# Patient Record
Sex: Female | Born: 1995 | Race: White | Hispanic: No | Marital: Single | Smoking: Current every day smoker
Health system: Southern US, Community
[De-identification: ages and names within clinical notes are randomized; demographics above are authoritative.]

## PROBLEM LIST (undated history)

## (undated) ENCOUNTER — Emergency Department (HOSPITAL_COMMUNITY): Discharge status: Absent without leave | Payer: Self-pay

## (undated) DIAGNOSIS — F1110 Opioid abuse, uncomplicated: Secondary | ICD-10-CM

## (undated) HISTORY — PX: OTHER SURGICAL HISTORY: SHX169

---
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
## 2014-04-18 IMAGING — US US ABDOMEN COMPLETE
1 series · 14 of 25 positions shown · non-contrast
Comparison: None.

CLINICAL DATA: Right upper quadrant abdominal pain

ABDOMINAL ULTRASOUND COMPLETE

[Series 1: us abdomen complete · 0.21mm/px · 14 of 68 slices shown]
[im 1/68]
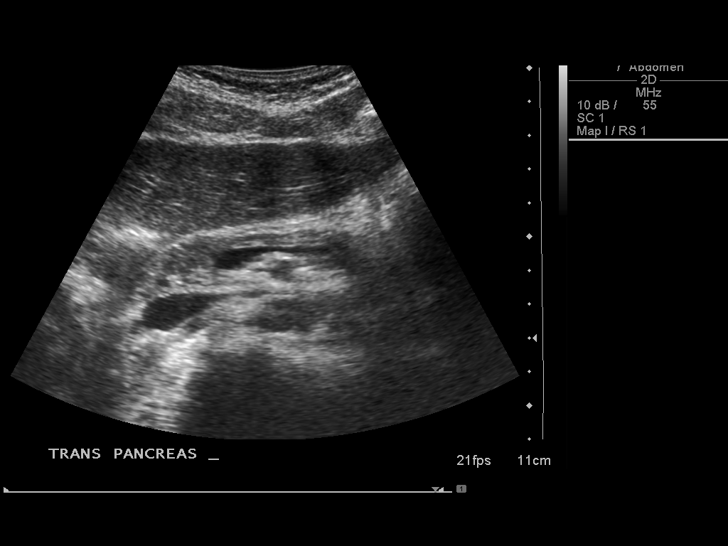
[im 6/68]
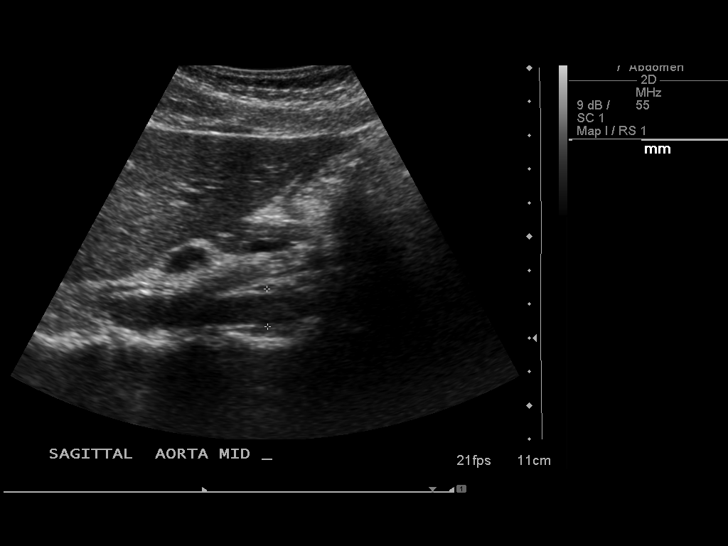
[im 12/68]
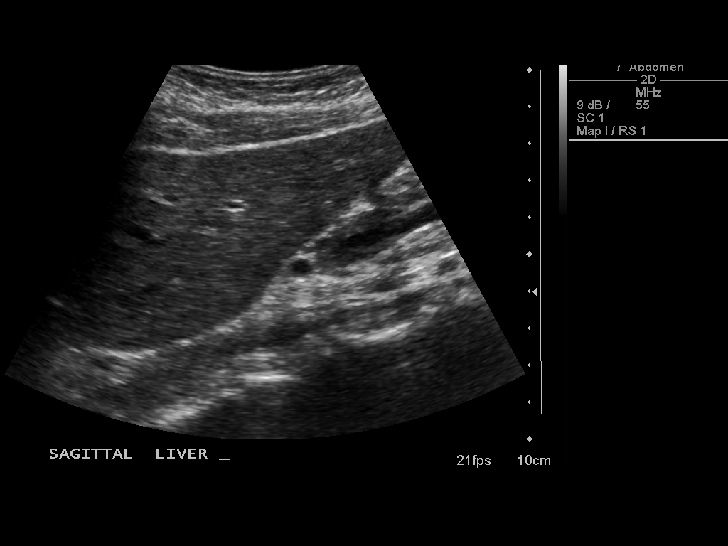
[im 17/68]
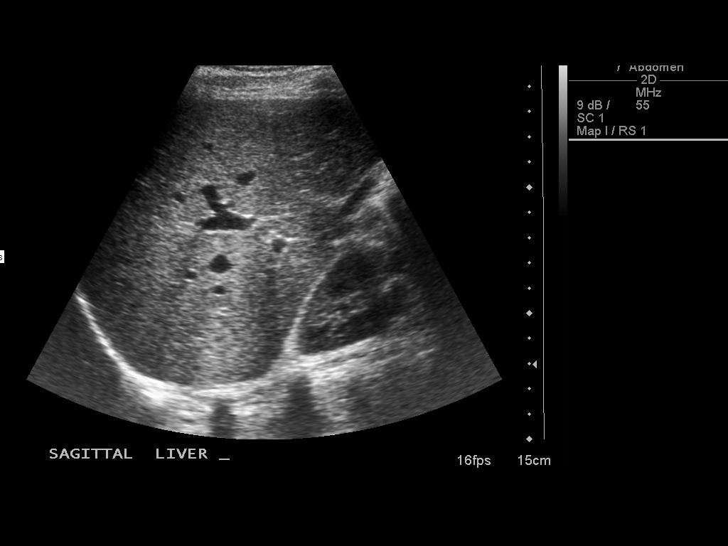
[im 23/68]
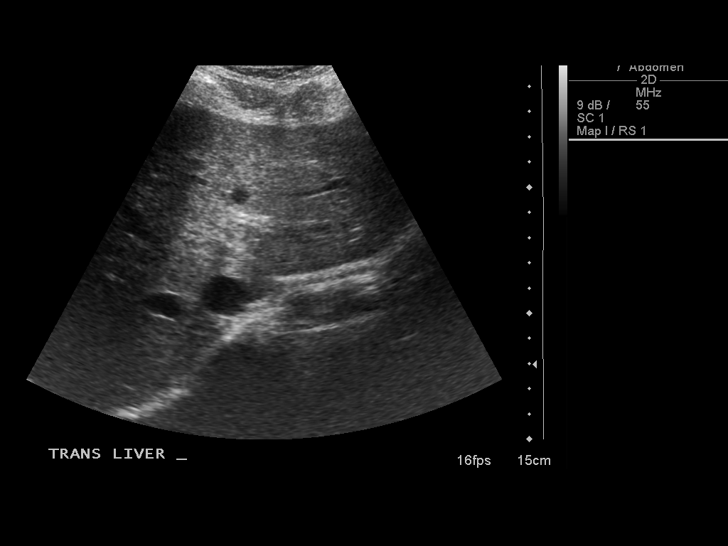
[im 26/68]
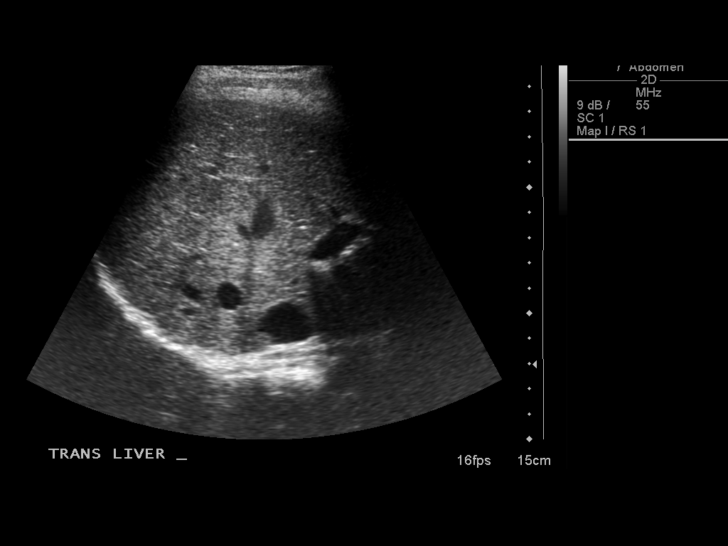
[im 31/68]
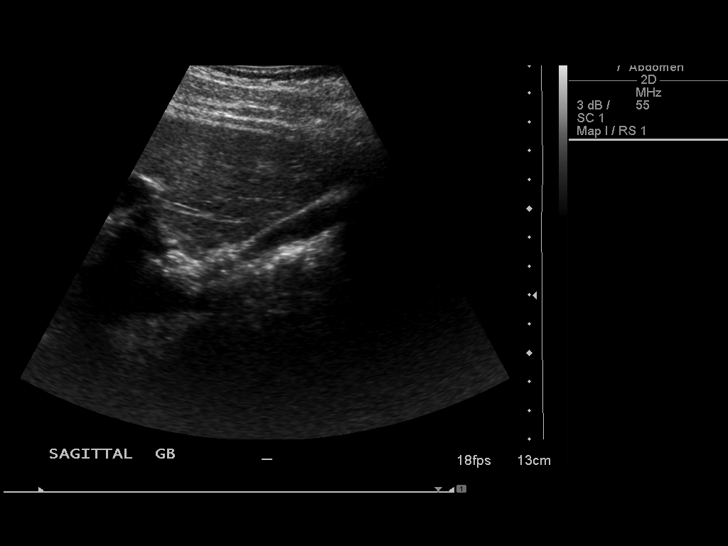
[im 37/68]
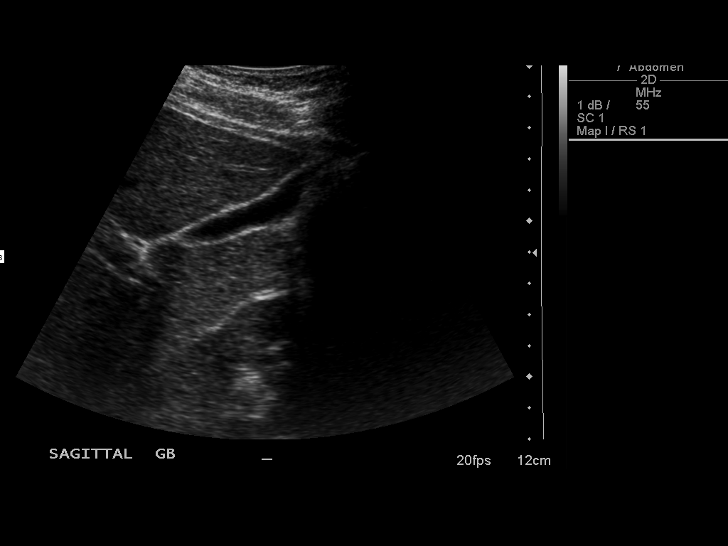
[im 42/68]
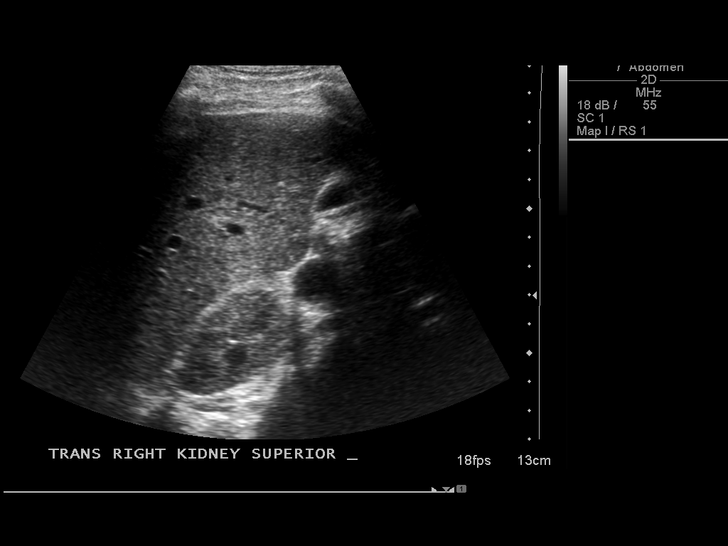
[im 45/68]
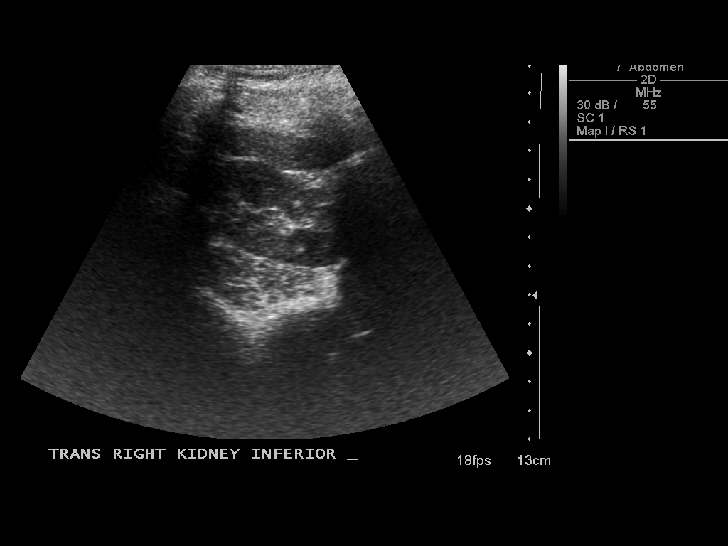
[im 51/68]
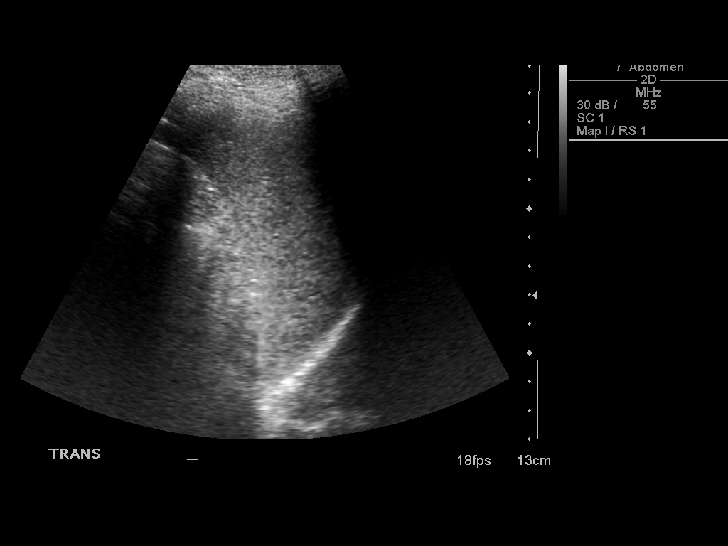
[im 56/68]
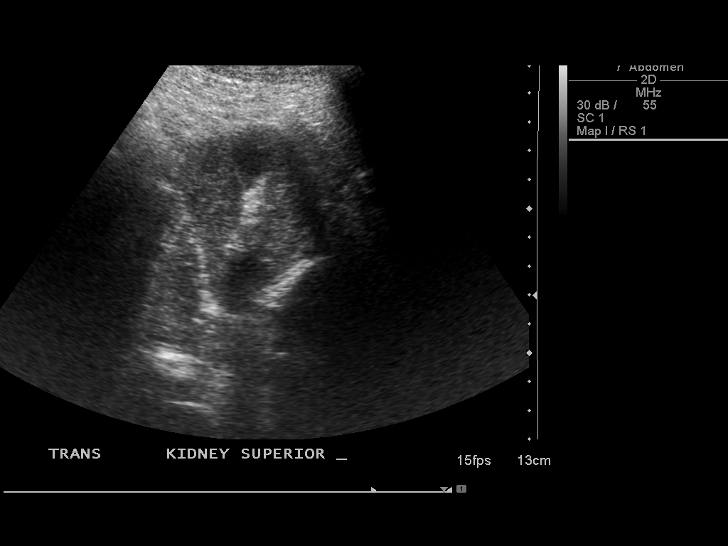
[im 62/68]
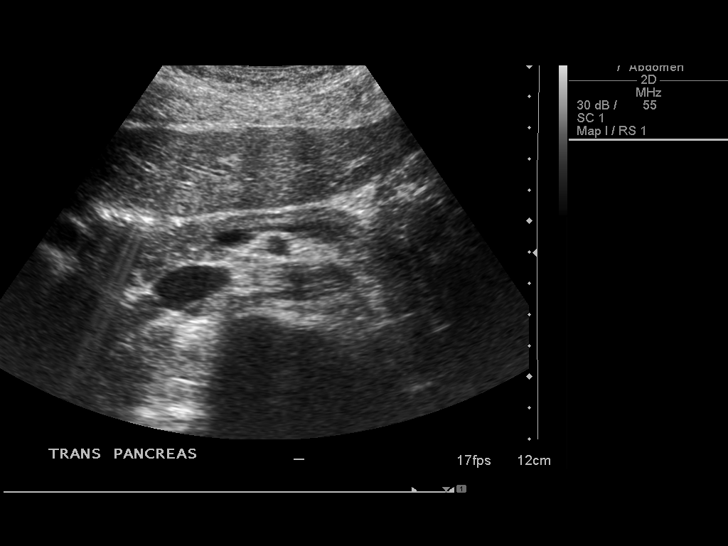
[im 68/68]
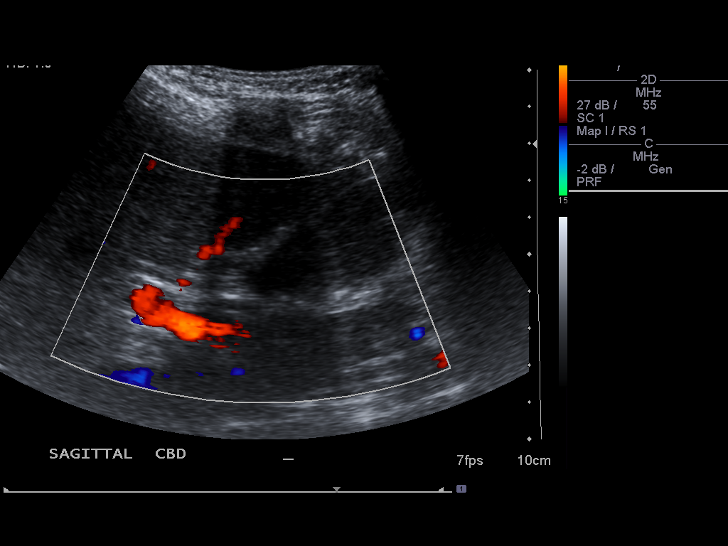

[14 of 25 positions shown; findings below may reference images not displayed]

FINDINGS: Gallbladder:  No gallstones, gallbladder wall thickening, or
pericholecystic fluid.

Common Bile Duct:  Within normal limits in caliber.

Liver: No focal mass lesion identified.  Within normal limits in
parenchymal echogenicity.

IVC:  Appears normal.

Pancreas:  No abnormality identified.

Spleen:  Within normal limits in size and echotexture.

Right kidney:  Normal in size and parenchymal echogenicity.  No
evidence of mass or hydronephrosis.

Left kidney:  Normal in size and parenchymal echogenicity.  No
evidence of mass or hydronephrosis.

Abdominal Aorta:  No aneurysm identified.
IMPRESSION: Negative abdominal ultrasound.

## 2014-08-17 ENCOUNTER — Inpatient Hospital Stay (HOSPITAL_COMMUNITY): Admit: 2014-08-17 | Payer: Self-pay

## 2014-10-06 ENCOUNTER — Ambulatory Visit (INDEPENDENT_AMBULATORY_CARE_PROVIDER_SITE_OTHER): Payer: Self-pay | Admitting: Emergency Medicine

## 2014-10-06 VITALS — BP 106/64 | HR 88 | Temp 98.0°F | Resp 18 | Ht 64.0 in | Wt 113.0 lb

## 2014-10-06 DIAGNOSIS — R0789 Other chest pain: Secondary | ICD-10-CM

## 2014-10-06 MED ORDER — TRAMADOL HCL 50 MG PO TABS: 50.0000 mg | ORAL_TABLET | Freq: Three times a day (TID) | ORAL | Status: DC | PRN

## 2014-10-06 MED ORDER — NAPROXEN SODIUM 550 MG PO TABS: 550.0000 mg | ORAL_TABLET | Freq: Two times a day (BID) | ORAL | Status: AC

## 2014-10-06 NOTE — Patient Instructions (Signed)

## 2014-10-06 NOTE — Progress Notes (Signed)
[facility] [address], [place] [id number] [phone]  Date:  10/06/2014   Name:  Emily StarrHeather R Logan   DOB:  10/05/1995   MRN:  [id number]  PCP:  No PCP Per Patient    Chief Complaint: Abdominal Pain; Mass; and Flank Pain   History of Present Illness:  Emily StarrHeather R Logan is a 19 y.o. very pleasant female patient who presents with the following:  Pain in right chest for past three weeks  Worse with position and breathing. Anterior and posterior chest wall Works stocking shelves and lifting No cough or coryza. No fever or chills No shortness of breath or wheezing No nausea or vomiting.  Appetite normal No improvement with over the counter medications or other home remedies.  Denies other complaint or health concern today.   There are no active problems to display for this patient.   History reviewed. No pertinent past medical history.  History reviewed. No pertinent past surgical history.  History  Substance Use Topics  . Smoking status: Current Every Day Smoker  . Smokeless tobacco: Not on file  . Alcohol Use: Yes    History reviewed. No pertinent family history.  No Known Allergies  Medication list has been reviewed and updated.  No current outpatient prescriptions on file prior to visit.   No current facility-administered medications on file prior to visit.    Review of Systems:  Review of Systems  Constitutional: Negative for fever, chills and fatigue.  HENT: Negative for congestion, ear pain, hearing loss, postnasal drip, rhinorrhea and sinus pressure.   Eyes: Negative for discharge and redness.  Respiratory: Negative for cough, shortness of breath and wheezing.   Cardiovascular: Negative for chest pain and leg swelling.  Gastrointestinal: Negative for nausea, vomiting, abdominal pain, constipation and blood in stool.  Genitourinary: Negative for dysuria, urgency and frequency.  Musculoskeletal: Negative for neck stiffness.  Skin:  Negative for rash.  Neurological: Negative for seizures, weakness and headaches.     Physical Examination: Filed Vitals:   10/06/14 2053  BP: 106/64  Pulse: 88  Temp: 98 F (36.7 C)  Resp: 18   Filed Vitals:   10/06/14 2053  Height: 5\' 4"  (1.626 m)  Weight: 113 lb (51.256 kg)   Body mass index is 19.39 kg/(m^2). Ideal Body Weight: Weight in (lb) to have BMI = 25: 145.3  GEN: WDWN, NAD, Non-toxic, A & O x 3 HEENT: Atraumatic, Normocephalic. Neck supple. No masses, No LAD. Ears and Nose: No external deformity. CV: RRR, No M/G/R. No JVD. No thrill. No extra heart sounds. PULM: CTA B, no wheezes, crackles, rhonchi. No retractions. No resp. distress. No accessory muscle use. ABD: S, NT, ND, +BS. No rebound. No HSM. EXTR: No c/c/e NEURO Normal gait.  PSYCH: Normally interactive. Conversant. Not depressed or anxious appearing.  Calm demeanor.  CHEST wall tender inferior right chest wall anteriorly.  No crepitus  Assessment and Plan: Chest wall pain Anaprox   Signed Phillips OdorJeffery Anderson, MD

## 2015-06-26 ENCOUNTER — Encounter (HOSPITAL_COMMUNITY): Payer: Self-pay | Admitting: *Deleted

## 2015-06-26 ENCOUNTER — Emergency Department (HOSPITAL_COMMUNITY)
Admission: EM | Admit: 2015-06-26 | Discharge: 2015-06-27 | Disposition: A | Discharge status: Home / Self Care | Payer: Self-pay | Attending: Emergency Medicine | Admitting: Emergency Medicine

## 2015-06-26 DIAGNOSIS — Z3202 Encounter for pregnancy test, result negative: Secondary | ICD-10-CM | POA: Insufficient documentation

## 2015-06-26 DIAGNOSIS — F1910 Other psychoactive substance abuse, uncomplicated: Secondary | ICD-10-CM

## 2015-06-26 DIAGNOSIS — F1110 Opioid abuse, uncomplicated: Secondary | ICD-10-CM | POA: Insufficient documentation

## 2015-06-26 DIAGNOSIS — R569 Unspecified convulsions: Secondary | ICD-10-CM | POA: Insufficient documentation

## 2015-06-26 DIAGNOSIS — F17200 Nicotine dependence, unspecified, uncomplicated: Secondary | ICD-10-CM | POA: Insufficient documentation

## 2015-06-26 DIAGNOSIS — Z791 Long term (current) use of non-steroidal anti-inflammatories (NSAID): Secondary | ICD-10-CM | POA: Insufficient documentation

## 2015-06-26 NOTE — ED Notes (Signed)
Called for triage no answer  

## 2015-06-26 NOTE — ED Notes (Signed)
PA-C to see and assess patient before RN assessment. See PA-C note. 

## 2015-06-26 NOTE — ED Notes (Signed)
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 Pt denies SI/HI. Pt denies any other complaints.

## 2015-06-27 LAB — I-STAT CHEM 8, ED
BUN: 19 mg/dL (ref 6–20)
CALCIUM ION: 1.28 mmol/L — AB (ref 1.12–1.23)
CHLORIDE: 97 mmol/L — AB (ref 101–111)
CREATININE: 0.8 mg/dL (ref 0.44–1.00)
GLUCOSE: 72 mg/dL (ref 65–99)
HCT: 47 % — ABNORMAL HIGH (ref 36.0–46.0)
HEMOGLOBIN: 16 g/dL — AB (ref 12.0–15.0)
POTASSIUM: 4.3 mmol/L (ref 3.5–5.1)
Sodium: 138 mmol/L (ref 135–145)
TCO2: 33 mmol/L (ref 0–100)

## 2015-06-27 LAB — POC URINE PREG, ED: Preg Test, Ur: NEGATIVE

## 2015-06-27 NOTE — Discharge Instructions (Signed)
1. Medications: usual home medications 2. Treatment: rest, drink plenty of fluids 3. Follow Up: please followup with your primary doctor and use resource list for outpatient detox; if you do not have a primary care doctor use the resource guide provided to find one; please return to the ER for new or worsening symptoms   Polysubstance Abuse When people abuse more than one drug or type of drug it is called polysubstance or polydrug abuse. For example, many smokers also drink alcohol. This is one form of polydrug abuse. Polydrug abuse also refers to the use of a drug to counteract an unpleasant effect produced by another drug. It may also be used to help with withdrawal from another drug. People who take stimulants may become agitated. Sometimes this agitation is countered with a tranquilizer. This helps protect against the unpleasant side effects. Polydrug abuse also refers to the use of different drugs at the same time.  Anytime drug use is interfering with normal living activities, it has become abuse. This includes problems with family and friends. Psychological dependence has developed when your mind tells you that the drug is needed. This is usually followed by physical dependence which has developed when continuing increases of drug are required to get the same feeling or "high". This is known as addiction or chemical dependency. A person's risk is much higher if there is a history of chemical dependency in the family. SIGNS OF CHEMICAL DEPENDENCY  You have been told by friends or family that drugs have become a problem.  You fight when using drugs.  You are having blackouts (not remembering what you do while using).  You feel sick from using drugs but continue using.  You lie about use or amounts of drugs (chemicals) used.  You need chemicals to get you going.  You are suffering in work performance or in school because of drug use.  You get sick from use of drugs but continue to use  anyway.  You need drugs to relate to people or feel comfortable in social situations.  You use drugs to forget problems. "Yes" answered to any of the above signs of chemical dependency indicates there are problems. The longer the use of drugs continues, the greater the problems will become. If there is a family history of drug or alcohol use, it is best not to experiment with these drugs. Continual use leads to tolerance. After tolerance develops more of the drug is needed to get the same feeling. This is followed by addiction. With addiction, drugs become the most important part of life. It becomes more important to take drugs than participate in the other usual activities of life. This includes relating to friends and family. Addiction is followed by dependency. Dependency is a condition where drugs are now needed not just to get high, but to feel normal. Addiction cannot be cured but it can be stopped. This often requires outside help and the care of professionals. Treatment centers are listed in the yellow pages under: Cocaine, Narcotics, and Alcoholics Anonymous. Most hospitals and clinics can refer you to a specialized care center. Talk to your caregiver if you need help.   This information is not intended to replace advice given to you by your health care provider. Make sure you discuss any questions you have with your health care provider.   Document Released: 01/12/2005 Document Revised: 08/15/2011 Document Reviewed: 05/28/2014 Elsevier Interactive Patient Education 2016 [employer].   Emergency Department Resource Guide 1) Find a Librarian, academic and Pay  Out of Pocket Although you won't have to find out who is covered by your insurance plan, it is a good idea to ask around and get recommendations. You will then need to call the office and see if the doctor you have chosen will accept you as a new patient and what types of options they offer for patients who are self-pay. Some doctors offer  discounts or will set up payment plans for their patients who do not have insurance, but you will need to ask so you aren't surprised when you get to your appointment.  2) Contact Your Local Health Department Not all health departments have doctors that can see patients for sick visits, but many do, so it is worth a call to see if yours does. If you don't know where your local health department is, you can check in your phone book. The CDC also has a tool to help you locate your state's health department, and many state websites also have listings of all of their local health departments.  3) Find a Walk-in Clinic If your illness is not likely to be very severe or complicated, you may want to try a walk in clinic. These are popping up all over the country in pharmacies, drugstores, and shopping centers. They're usually staffed by nurse practitioners or physician assistants that have been trained to treat common illnesses and complaints. They're usually fairly quick and inexpensive. However, if you have serious medical issues or chronic medical problems, these are probably not your best option.  No Primary Care Doctor: - Call Health Connect at  [phone] - they can help you locate a primary care doctor that  accepts your insurance, provides certain services, etc. - Physician Referral Service- [phone]  Chronic Pain Problems: Organization         Address  Phone   Notes  [facility]  [phone] Patients need to be referred by their primary care doctor.   Medication Assistance: Organization         Address  Phone   Notes  [facility] Medication [facility] [address] [place], [place] [address] [phone] --Must be a resident of [place] -- Must have NO insurance coverage whatsoever (no Medicaid/ Medicare, etc.) -- The pt. MUST have a primary care doctor that directs their care regularly and follows them in the community   MedAssist   [phone]   [employer]  [phone]    Agencies that provide inexpensive medical care: Organization         Address  Phone   Notes  [facility]  [phone]   [facility]    [phone]   [facility] [address] [place], [place] [address] [phone]   [facility] [address], [place] [phone]   [facility]anned Parenthood    [phone]) [phone]   [facility]    [phone]   [facility] and [facility]  [address], [place] Phone:  [phone], Fax:  [phone] Hours of Operation:  9 am - 6 pm, M-F.  Also accepts Medicaid/Medicare and self-pay.  [facility]  [address], [place] Phone: [phone], Fax: [phone]) [phone]. Hours of Operation:  8:30 am - 5:30 pm, M-F.  Also accepts Medicaid and self-pay.  [facility] [place] [address], [place] Phone: [phone]) [phone]  [facility] [address], [place], [place] [phone], Ext. [phone] Mondays & Thursdays: 7-9 AM.  First 15 patients are seen on a first come, first serve basis.    Medicaid-accepting [facility] [place] Providers:  Organization         Address  Phone   Notes  [facility] [address], [place] [phone] Also accepts self-pay patients.  [facility] [address], [place]  [phone]) [phone]   [facility] [address], [place] [phone]   [facility] [address] [place] [address], [place] [phone]   Renaye RakersVeita Bland [address], [place]   [phone] Only accepts [place] Access [place] patients after they have their name applied to their card.   Self-Pay (no insurance) in [place] [place]:  Organization         Address  Phone   Notes  Sickle Cell Patients, [facility] [address], [place] [phone]   [facility] [address], [place] [phone]   [facility] [place]  [address],  [phone]   [facility]/Dr. Osei-Bonsu  [address] [place] [address], [place] or [phone], [place] [phone] Phone number for both [place] and [place] locations is the same.  [facility] [address], [place] [phone]   [facility] [place] [address] [place] [address], [place] or [address] [phone](336) [phone]) [phone]   [facility] [address], [place] [phone], phone; [phone], fax Sees patients 1st and 3rd Saturday of every month.  Must not qualify for public or private insurance (i.e. Medicaid, Medicare, [place] Health Choice, Veterans' Benefits)  Household income should be no more than 200% of the poverty level The clinic cannot treat you if you are pregnant or think you are pregnant  Sexually transmitted diseases are not treated at the clinic.    Dental Care: Organization         Address  Phone  Notes  [facility] of [facility] [address], [place] [phone] Accepts children up to age 621 who are enrolled in [place]icaid or [place] Health Choice; pregnant women with a Medicaid card; and children who have applied for Medicaid or [place] Health Choice, but were declined, whose parents can pay a reduced fee at time of service.  [facility] [place] [facility] of [facility] [place]  [address], [place] [phone]) [phone] Accepts children up to age 22 who are enrolled in [place] or [place] Health Choice; pregnant women with a Medicaid card; and children who have applied for Medicaid or [place] Health Choice, but were declined, whose parents can pay a reduced fee at time of service.  [facility]  [address], [place]  [phone] Patients are seen by appointment only. Walk-ins are not accepted. [facility] will see patients 22 years of age and older. Monday - Tuesday (8am-5pm) Most Wednesdays (8:30-5pm) $30 per visit, cash only  [facility]  [address], [facility] [phone] Patients are seen by appointment only. Walk-ins are not accepted. [facility] will see patients 22 years of age and older. One Wednesday Evening (Monthly: Volunteer Based).  $30 per visit,  cash only  [employer] [facility]  [phone] for adults; Children under age 864, call Graduate Pediatric Dentistry at [phone]. Children aged 404-14, please call [phone]) [phone] to request a pediatric application.  Dental services are provided in all areas of dental care including fillings, crowns and bridges, complete and partial dentures, implants, gum treatment, root canals, and extractions. Preventive care is also provided. Treatment is provided to both adults and children. Patients are selected via a lottery and there is often a waiting list.   [facility] [address], [place]  [phone] [email or web address]   [facility] [address], [place], [place] [phone], Ext. [phone] Second and Fourth Thursday of each month, opens at 6:30 AM; Clinic ends at 9 AM.  Patients are seen on a first-come first-served basis, and a limited number are seen during each clinic.   [facility]  [address], [place], [place] [phone]) [phone]   Eligibility Requirements You must have lived in [place], [place], or [place] counties for at least the last three months.   You cannot be eligible for state or federal sponsored National Cityhealthcare insurance, including CIGNAVeterans Administration, [place]icaid, or [employer] EntertainmentMedicare.   You generally cannot be eligible for healthcare insurance through your employer.    How to apply: Eligibility screenings are held every Tuesday and Wednesday  afternoon from 1:00 pm until 4:00 pm. You do not need an appointment for the interview!  [facility] [address], [place], [place] [phone]   [facility]  [phone]   [facility]  [phone]   [facility]  [phone]    Behavioral Health Resources in the Community: Intensive Outpatient Programs Organization         Address  Phone  Notes  [facility] [address], [place], [place] [phone]   [facility] [address], [place], [place] [phone]   [facility] [address], [place], [place]  [phone]   [facility] [address],  [place], [place] [phone] or [phone]   Substance Abuse Resources Organization         Address  Phone  Notes  [facility]  [phone]   [facility]  [phone]   [facility]  [phone]   [facility]  [phone]   Residential & Outpatient Substance Abuse Program  [phone]   Psychological Services Organization         Address  Phone  Notes  [facility]  [phone]- [phone]   [facility]  [phone]   [facility] [address], [place] [phone] or [phone]    Mobile Crisis Teams Organization         Address  Phone  Notes  [facility], Mobile Crisis Care Unit  [phone]   [facility]  [address] [place], [place] [phone]   Doristine LocksSharon DeEsch [address] [place] [phone]    Self-Help/Support Groups Organization         Address  Phone             Notes  [facility] [place] - variety of support groups  336- [phone] Call for more information  Narcotics Anonymous (NA), [facility] [address], [place]  2 meetings at this location   Nutritional therapistesidential Treatment  Programs Organization  Address  Phone  Notes  °[facility] [address],    °[place]  [phone]   °[facility] ° [address], [place], [place] [phone]   °[facility] [address], [place] [phone] Admissions: 8am-3pm M-F  °[facility] [address]    °[place], [place] [phone]   °[facility] [address], [place], [place] [phone]   °[facility] [address]  °[place], [place] [phone]   °[facility] - Intensive Outpatient [address], [place], [place] [phone]   °[facility] ([facility] [address]  °[place], [place] 1-877-615-2722 or [phone]   °[facility] ([facility]) [address] [place], [place] [phone] Accepts Medicaid  °[facility] [address]  °[place] 1-800-659-3381 Substance Abuse/Addiction Treatment  ° °[place] Behavioral Health Resources °Organization         Address  Phone  Notes  °[facility]  ([phone]) [phone]   °Julie Brannon, PhD [address] [place], [place]   ([phone]) [phone] or (336) 951-0000   °[facility]   [address] °[place], [place] (336) [phone]   °[facility] [address], [place], [place] (336) [phone] Insurance/Medicaid/sponsorship through [facility]  °[facility] [address]                                    [place], [place] ([phone]) [phone] Therapy/tele-psych/case  °[facility] [address]  ° [place],  ([phone]) [phone]    °Dr. Arfeen  ([phone]) [phone]   °[facility] [place]  [employer] [place] [facility] 1) [address], [place] °2) [address], [place] °3)  [address], [place] (336) [phone] °(336) [phone] ° °(336) [phone]   °[place] Child Abuse Hotline (336) 342-1394 or (336) [phone] (After Hours)    ° ° ° °

## 2015-06-27 NOTE — ED Provider Notes (Signed)
CSN: [id number]     Arrival date & time 06/26/15  2301 History   First MD Initiated Contact with Patient 06/26/15 2330     Chief Complaint  Patient presents with  . Addiction Problem    HPI   Emily Logan is a 20 y.o. 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 She states she uses 2 grams daily. She denies additional drug use. She denies alcohol use. She denies homicidal or suicidal ideation. She denies hallucinations. She denies fever, chills, headaches, lightheadedness, dizziness, numbness, weakness, paresthesia, chest pain, shortness of breath, abdominal pain, N/V. She states she has experienced intermittent "seizures" over the past month, during which she "tenses up and falls out." She reports this last occurred several days ago and denies exacerbating or alleviating factors.    History reviewed. No pertinent past medical history. History reviewed. No pertinent past surgical history. History reviewed. No pertinent family history. Social History  Substance Use Topics  . Smoking status: Current Every Day Smoker  . Smokeless tobacco: Never Used  . Alcohol Use: No   OB History    No data available      Review of Systems  Constitutional: Negative for fever and chills.  Eyes: Negative for visual disturbance.  Respiratory: Negative for shortness of breath.   Cardiovascular: Negative for chest pain.  Gastrointestinal: Negative for nausea, vomiting, abdominal pain, diarrhea and constipation.  Neurological: Positive for seizures. Negative for dizziness, syncope, weakness, light-headedness, numbness and headaches.  Psychiatric/Behavioral: Negative for suicidal ideas and hallucinations.  All other systems reviewed and are negative.     Allergies  Review of patient's allergies indicates no known allergies.  Home Medications   Prior to Admission medications   Medication Sig Start Date End Date Taking? Authorizing Provider  naproxen sodium  (ANAPROX DS) 550 MG tablet Take 1 tablet (550 mg total) by mouth 2 (two) times daily with a meal. 10/06/14 10/06/15  Carmelina Dane, MD  traMADol (ULTRAM) 50 MG tablet Take 1 tablet (50 mg total) by mouth every 8 (eight) hours as needed. 10/06/14   Carmelina Dane, MD    BP 112/68 mmHg  Pulse 97  Temp(Src) 97.5 F (36.4 C) (Oral)  Resp 18  SpO2 100% Physical Exam  Constitutional: She is oriented to person, place, and time. She appears well-developed and well-nourished. No distress.  HENT:  Head: Normocephalic and atraumatic.  Right Ear: External ear normal.  Left Ear: External ear normal.  Nose: Nose normal.  Mouth/Throat: Uvula is midline, oropharynx is clear and moist and mucous membranes are normal.  Eyes: Conjunctivae, EOM and lids are normal. Pupils are equal, round, and reactive to light. Right eye exhibits no discharge. Left eye exhibits no discharge. No scleral icterus.  Neck: Normal range of motion. Neck supple.  Cardiovascular: Normal rate, regular rhythm, normal heart sounds, intact distal pulses and normal pulses.   Pulmonary/Chest: Effort normal and breath sounds normal. No respiratory distress. She has no wheezes. She has no rales.  Abdominal: Soft. Normal appearance and bowel sounds are normal. She exhibits no distension and no mass. There is no tenderness. There is no rigidity, no rebound and no guarding.  Musculoskeletal: Normal range of motion. She exhibits no edema or tenderness.  Neurological: She is alert and oriented to person, place, and time. She has normal strength. No cranial nerve deficit or sensory deficit.  Skin: Skin is warm, dry and intact. No rash noted. She is not diaphoretic. No erythema.  No pallor.  Psychiatric: She has a normal mood and affect. Her speech is normal and behavior is normal. She expresses no homicidal and no suicidal ideation. She expresses no suicidal plans and no homicidal plans.  Nursing note and vitals reviewed.   ED Course   Procedures (including critical care time)  Labs Review Labs Reviewed  I-STAT CHEM 8, ED - Abnormal; Notable for the following:    Chloride 97 (*)    Calcium, Ion 1.28 (*)    Hemoglobin 16.0 (*)    HCT 47.0 (*)    All other components within normal limits  POC URINE PREG, ED    Imaging Review No results found.   I have personally reviewed and evaluated these lab results as part of my medical decision-making.   EKG Interpretation None      MDM   Final diagnoses:  Drug abuse    20 year old female presents for heroin detox. Also reports intermittent seizures.   Patient is afebrile. Vital signs stable. Normal neuro exam with no focal deficit. Heart RRR. Lungs clear to auscultation bilaterally. Abdomen soft, non-tender, non-distended. Patient moves all extremities and ambulates without difficulty.  Patient discussed with Dr. Verdie Mosher. Will obtain EKG, urine pregnancy, and chem 8. EKG sinus rhythm. Urine pregnancy negative. Chem 8 unremarkable.  Patient is non-toxic and well-appearing, feel she is stable for discharge at this time. No seizure activity in the ED. Patient to follow-up with neurology and to use resource list for outpatient detox. Return precautions discussed. Patient verbalizes her understanding and is in agreement with plan.  BP 112/68 mmHg  Pulse 97  Temp(Src) 97.5 F (36.4 C) (Oral)  Resp 18  SpO2 100%     Mady Gemma, PA-C 06/27/15 1308  Lavera Guise, MD 06/27/15 1316

## 2015-06-27 NOTE — ED Notes (Signed)
Patient verbalized understanding of discharge instructions and denies any further needs or questions at this time. VS stable. Patient ambulatory with steady gait. Patient given resource guide to contact a facility for detox.

## 2015-07-23 ENCOUNTER — Emergency Department
Admission: EM | Admit: 2015-07-23 | Discharge: 2015-07-23 | Discharge status: Against Medical Advice | Payer: Self-pay | Attending: Emergency Medicine | Admitting: Emergency Medicine

## 2015-07-23 ENCOUNTER — Encounter: Payer: Self-pay | Admitting: Emergency Medicine

## 2015-07-23 DIAGNOSIS — Z5321 Procedure and treatment not carried out due to patient leaving prior to being seen by health care provider: Secondary | ICD-10-CM | POA: Insufficient documentation

## 2015-07-23 DIAGNOSIS — Z5329 Procedure and treatment not carried out because of patient's decision for other reasons: Secondary | ICD-10-CM

## 2015-07-23 DIAGNOSIS — F17200 Nicotine dependence, unspecified, uncomplicated: Secondary | ICD-10-CM | POA: Insufficient documentation

## 2015-07-23 DIAGNOSIS — F1110 Opioid abuse, uncomplicated: Secondary | ICD-10-CM | POA: Insufficient documentation

## 2015-07-23 DIAGNOSIS — Z5320 Procedure and treatment not carried out because of patient's decision for unspecified reasons: Secondary | ICD-10-CM

## 2015-07-23 NOTE — ED Notes (Signed)

## 2015-07-23 NOTE — Discharge Instructions (Signed)
Opioid Use Disorder  Opioid use disorder is a mental disorder. It is the continued nonmedical use of opioids in spite of risks to health and well-being. Misused opioids include the street drug heroin. They also include pain medicines such as morphine, hydrocodone, oxycodone, and fentanyl. Opioids are very addictive. People who misuse opioids get an exaggerated feeling of well-being. Opioid use disorder often disrupts activities at home, work, or school. It may cause mental or physical problems.   A family history of opioid use disorder puts you at higher risk of it. People with opioid use disorder often misuse other drugs or have mental illness such as depression, posttraumatic stress disorder, or antisocial personality disorder. They also are at risk of suicide and death from overdose.  SIGNS AND SYMPTOMS   Signs and symptoms of opioid use disorder include:  · Use of opioids in larger amounts or over a longer period than intended.  · Unsuccessful attempts to cut down or control opioid use.  · A lot of time spent obtaining, using, or recovering from the effects of opioids.  · A strong desire or urge to use opioids (craving).  · Continued use of opioids in spite of major problems at work, school, or home because of use.  · Continued use of opioids in spite of relationship problems because of use.  · Giving up or cutting down on important life activities because of opioid use.  · Use of opioids over and over in situations when it is physically hazardous, such as driving a car.  · Continued use of opioids in spite of a physical problem that is likely related to use. Physical problems can include:  ¨ Severe constipation.  ¨ Poor nutrition.  ¨ Infertility.  ¨ Tuberculosis.  ¨ Aspiration pneumonia.  ¨ Infections such as human immunodeficiency virus (HIV) and hepatitis (from injecting opioids).  · Continued use of opioids in spite of a mental problem that is likely related to use. Mental problems can  include:  ¨ Depression.  ¨ Anxiety.  ¨ Hallucinations.  ¨ Sleep problems.  ¨ Loss of sexual function.  · Need to use more and more opioids to get the same effect, or lessened effect over time with use of the same amount (tolerance).  · Having withdrawal symptoms when opioid use is stopped, or using opioids to reduce or avoid withdrawal symptoms. Withdrawal symptoms include:  ¨ Depressed, anxious, or irritable mood.  ¨ Nausea, vomiting, diarrhea, or intestinal cramping.  ¨ Muscle aches or spasms.  ¨ Excessive tearing or runny nose.  ¨ Dilated pupils, sweating, or hairs standing on end.  ¨ Yawning.  ¨ Fever, raised blood pressure, or fast pulse.  ¨ Restlessness or trouble sleeping. This does not apply to people taking opioids for medical reasons only.  DIAGNOSIS  Opioid use disorder is diagnosed by your health care provider. You may be asked questions about your opioid use and and how it affects your life. A physical exam may be done. A drug screen may be ordered. You may be referred to a mental health professional. The diagnosis of opioid use disorder requires at least two symptoms within 12 months. The type of opioid use disorder you have depends on the number of signs and symptoms you have. The type may be:  · Mild. Two or three signs and symptoms.     · Moderate. Four or five signs and symptoms.    · Severe. Six or more signs and symptoms.  TREATMENT   Treatment is usually provided by mental   health professionals with training in substance use disorders. The following options are available:  · Detoxification. This is the first step in treatment for withdrawal. It is medically supervised withdrawal with the use of medicines. These medicines lessen withdrawal symptoms. They also raise the chance of becoming opioid free.  · Counseling, also known as talk therapy. Talk therapy addresses the reasons you use opioids. It also addresses ways to keep you from using again (relapse). The goals of talk therapy are to avoid  relapse by:  ¨ Identifying and avoiding triggers for use.  ¨ Finding healthy ways to cope with stress.  ¨ Learning how to handle cravings.  · Support groups. Support groups provide emotional support, advice, and guidance.  · A medicine that blocks opioid receptors in your brain. This medicine can reduce opioid cravings that lead to relapse. This medicine also blocks the desired opioid effect when relapse occurs.  · Opioids that are taken by mouth in place of the misused opioid (opioid maintenance treatment). These medicines satisfy cravings but are safer than commonly misused opioids. This often is the best option for people who continue to relapse with other treatments.  HOME CARE INSTRUCTIONS   · Take medicines only as directed by your health care provider.  · Check with your health care provider before starting new medicines.  · Keep all follow-up visits as directed by your health care provider.  SEEK MEDICAL CARE IF:  · You are not able to take your medicines as directed.  · Your symptoms get worse.  SEEK IMMEDIATE MEDICAL CARE IF:  · You have serious thoughts about hurting yourself or others.  · You may have taken an overdose of opioids.  FOR MORE INFORMATION  · National Institute on Drug Abuse: www.drugabuse.gov  · Substance Abuse and Mental Health Services Administration: www.samhsa.gov     This information is not intended to replace advice given to you by your health care provider. Make sure you discuss any questions you have with your health care provider.     Document Released: 03/20/2007 Document Revised: 06/13/2014 Document Reviewed: 06/05/2013  Elsevier Interactive Patient Education ©2016 [employer]

## 2015-07-23 NOTE — ED Notes (Signed)
20 yof PMhx IV drug use presents to ED via EMS. Per EMS she called 911 after her boyfriend went unresponsive from 'shooting heroin'. She presents AAOx3, slightly slurred speech. Steady gait. Demanding to leave ED.

## 2015-07-23 NOTE — ED Provider Notes (Addendum)
[facility] Emergency Department Provider Note  ____________________________________________  Time seen: Approximately 1030 PM  I have reviewed the triage vital signs and the nursing notes.   HISTORY  Chief Complaint Drug Overdose    HPI Emily Logan is a 20 y.o. female history of heroin addiction who is presenting tonight with EMS after using heroin. She was not somnolent and did not require any Narcan. She initially had a slightly slurred speech which has now cleared. She admits to using heroin, shooting in her right hand, several hours ago. She denies using any other substances. She has no complaints at this time. She is here with her boyfriend, who she had initially called EMS for because of him being unresponsive after injecting heroin. She is wishing to leave without any further evaluation at this time.   History reviewed. No pertinent past medical history.  There are no active problems to display for this patient.   History reviewed. No pertinent past surgical history.  Current Outpatient Rx  Name  Route  Sig  Dispense  Refill  . naproxen sodium (ANAPROX DS) 550 MG tablet   Oral   Take 1 tablet (550 mg total) by mouth 2 (two) times daily with a meal. Patient not taking: Reported on 07/23/2015   40 tablet   0   . traMADol (ULTRAM) 50 MG tablet   Oral   Take 1 tablet (50 mg total) by mouth every 8 (eight) hours as needed. Patient not taking: Reported on 07/23/2015   30 tablet   0     Allergies Review of patient's allergies indicates no known allergies.  History reviewed. No pertinent family history.  Social History Social History  Substance Use Topics  . Smoking status: Current Every Day Smoker  . Smokeless tobacco: Never Used  . Alcohol Use: No    Review of Systems Constitutional: No fever/chills Eyes: No visual changes. ENT: No sore throat. Cardiovascular: Denies chest pain. Respiratory: Denies shortness of  breath. Gastrointestinal: No abdominal pain.  No nausea, no vomiting.  No diarrhea.  No constipation. Genitourinary: Negative for dysuria. Musculoskeletal: Negative for back pain. Skin: Negative for rash. Neurological: Negative for headaches, focal weakness or numbness.  10-point ROS otherwise negative.  ____________________________________________   PHYSICAL EXAM:  VITAL SIGNS: ED Triage Vitals  Enc Vitals Group     BP 07/23/15 2225 126/78 mmHg     Pulse Rate 07/23/15 2225 101     Resp 07/23/15 2225 10     Temp 07/23/15 2225 97.2 F (36.2 C)     Temp src --      SpO2 07/23/15 2225 92 %     Weight 07/23/15 2225 110 lb (49.896 kg)     Height 07/23/15 2225  (1.626 m)     Head Cir --      Peak Flow --      Pain Score --      Pain Loc --      Pain Edu? --      Excl. in GC? --     Constitutional: Alert and oriented. Well appearing and in no acute distress. Eyes: Conjunctivae are normal. PERRL. EOMI. Head: Atraumatic. Nose: No congestion/rhinnorhea. Mouth/Throat: Mucous membranes are moist.   Neck: No stridor.   Cardiovascular: Normal rate, regular rhythm. Grossly normal heart sounds.  Good peripheral circulation. Respiratory: Normal respiratory effort.  No retractions. Lungs CTAB. Gastrointestinal: Soft and nontender. No distention.  Musculoskeletal: No lower extremity tenderness nor edema.  No joint effusions.  Neurologic:  Normal speech and language. No gross focal neurologic deficits are appreciated. No gait instability. Skin:  Skin is warm, dry and intact. No rash noted. Right thumb with several punctate purple lesions which appears to track marks. There is no tenderness, induration or pus surrounding.  Psychiatric: Mood and affect are normal. Speech and behavior are normal.  ____________________________________________   LABS (all labs ordered are listed, but only abnormal results are displayed)  Labs Reviewed - No data to  display ____________________________________________  EKG   ____________________________________________  RADIOLOGY   ____________________________________________   PROCEDURES   ____________________________________________   INITIAL IMPRESSION / ASSESSMENT AND PLAN / ED COURSE  Pertinent labs & imaging results that were available during my care of the patient were reviewed by me and considered in my medical decision making (see chart for details).  Patient wishing to leave AGAINST MEDICAL ADVICE at this time. She says she has not used heroin several hours. She is alert and oriented and clinically sober at this time. She has insight into her illness and has capacity to make decisions. I discussed with her further observation of her boyfriend as his Narcan may wear off and he made become unconscious and stopped breathing again. She says that she will be able to watch him. She knows that she may return to the emergency department at any time for further observation. She is aware that a prolonged observation period about 4 hours would be optimal for her and her boyfriend but she is still wishing to leave. She knows that leaving without prolonged observation may result in death or permanent disability including brain damage. She does she may return to the emergency department any time. ____________________________________________   FINAL CLINICAL IMPRESSION(S) / ED DIAGNOSES  Heroin abuse. Against medical advise discharge.    Myrna Blazer, MD 07/23/15 2254 Patient denies any suicidal intent. Uses recreationally.   Myrna Blazer, MD 07/23/15 [phone]

## 2017-08-02 ENCOUNTER — Emergency Department (HOSPITAL_COMMUNITY)
Admission: EM | Admit: 2017-08-02 | Discharge: 2017-08-02 | Disposition: A | Discharge status: Home / Self Care | Payer: Self-pay | Attending: Emergency Medicine | Admitting: Emergency Medicine

## 2017-08-02 ENCOUNTER — Encounter (HOSPITAL_COMMUNITY): Payer: Self-pay | Admitting: Emergency Medicine

## 2017-08-02 DIAGNOSIS — F17210 Nicotine dependence, cigarettes, uncomplicated: Secondary | ICD-10-CM | POA: Insufficient documentation

## 2017-08-02 DIAGNOSIS — N72 Inflammatory disease of cervix uteri: Secondary | ICD-10-CM | POA: Insufficient documentation

## 2017-08-02 LAB — URINALYSIS, ROUTINE W REFLEX MICROSCOPIC
Bacteria, UA: NONE SEEN
Bilirubin Urine: NEGATIVE
Glucose, UA: NEGATIVE mg/dL
HGB URINE DIPSTICK: NEGATIVE
Ketones, ur: NEGATIVE mg/dL
Nitrite: NEGATIVE
Protein, ur: NEGATIVE mg/dL
SPECIFIC GRAVITY, URINE: 1.019 (ref 1.005–1.030)
pH: 6 (ref 5.0–8.0)

## 2017-08-02 LAB — WET PREP, GENITAL
CLUE CELLS WET PREP: NONE SEEN
SPERM: NONE SEEN
Trich, Wet Prep: NONE SEEN
YEAST WET PREP: NONE SEEN

## 2017-08-02 LAB — PREGNANCY, URINE: PREG TEST UR: NEGATIVE

## 2017-08-02 MED ORDER — LIDOCAINE HCL 1 % IJ SOLN
INTRAMUSCULAR | Status: AC
  Administered 2017-08-02: 0.9 mL
  Filled 2017-08-02: qty 20

## 2017-08-02 MED ORDER — AZITHROMYCIN 250 MG PO TABS
1000.0000 mg | ORAL_TABLET | Freq: Once | ORAL | Status: AC
  Administered 2017-08-02: 1000 mg via ORAL
  Filled 2017-08-02: qty 4

## 2017-08-02 MED ORDER — CEFTRIAXONE SODIUM 250 MG IJ SOLR
250.0000 mg | Freq: Once | INTRAMUSCULAR | Status: AC
  Administered 2017-08-02: 250 mg via INTRAMUSCULAR
  Filled 2017-08-02: qty 250

## 2017-08-02 NOTE — Discharge Instructions (Signed)
You have been treated for gonorrhea and chlamydia tonight.  RN will advise you on how to find your test results.  Your sexual partners should also be treated.

## 2017-08-02 NOTE — ED Provider Notes (Signed)
[facility]-EMERGENCY DEPT Provider Note   CSN: [id number] Arrival date & time: 08/02/17  1750     History   Chief Complaint Chief Complaint  Patient presents with  . Vaginal Discharge    HPI Emily Logan is a 22 y.o. female.  Vaginal discharge with significant odor for the past month.  Discharge is watery, tenacious, alternates between clear and yellow in color.  Patient is sexually active without condoms.  No fever, sweats, chills.  Patient is normally healthy.  No previous genitourinary infections.      History reviewed. No pertinent past medical history.  There are no active problems to display for this patient.   History reviewed. No pertinent surgical history.  OB History    No data available       Home Medications    Prior to Admission medications   Medication Sig Start Date End Date Taking? Authorizing Provider  traMADol (ULTRAM) 50 MG tablet Take 1 tablet (50 mg total) by mouth every 8 (eight) hours as needed. Patient not taking: Reported on 07/23/2015 10/06/14   Carmelina Dane, MD    Family History No family history on file.  Social History Social History   Tobacco Use  . Smoking status: Current Every Day Smoker  . Smokeless tobacco: Never Used  Substance Use Topics  . Alcohol use: No  . Drug use: Yes    Types: IV    Comment: heroin     Allergies   Patient has no known allergies.   Review of Systems Review of Systems  All other systems reviewed and are negative.    Physical Exam Updated Vital Signs BP (!) 103/52 (BP Location: Right Arm)   Pulse 94   Temp 98.4 F (36.9 C) (Oral)   Resp 18   LMP 06/12/2017   SpO2 99%   Physical Exam  Constitutional: She is oriented to person, place, and time. She appears well-developed and well-nourished.  HENT:  Head: Normocephalic and atraumatic.  Eyes: Conjunctivae are normal.  Neck: Neck supple.  Cardiovascular: Normal rate and regular rhythm.    Pulmonary/Chest: Effort normal and breath sounds normal.  Abdominal: Soft. Bowel sounds are normal.  Genitourinary:  Genitourinary Comments: Normal external genitalia.  Light clear yellow.  Cervical discharge noted.  No cervical motion tenderness.  No adnexal tenderness or masses.  Musculoskeletal: Normal range of motion.  Neurological: She is alert and oriented to person, place, and time.  Skin: Skin is warm and dry.  Psychiatric: She has a normal mood and affect. Her behavior is normal.  Nursing note and vitals reviewed.    ED Treatments / Results  Labs (all labs ordered are listed, but only abnormal results are displayed) Labs Reviewed  WET PREP, GENITAL - Abnormal; Notable for the following components:      Result Value   WBC, Wet Prep HPF POC RARE (*)    All other components within normal limits  URINALYSIS, ROUTINE W REFLEX MICROSCOPIC - Abnormal; Notable for the following components:   Leukocytes, UA SMALL (*)    Squamous Epithelial / LPF 0-5 (*)    All other components within normal limits  PREGNANCY, URINE  GC/CHLAMYDIA PROBE AMP ([place]) NOT AT [facility]    EKG  EKG Interpretation None       Radiology No results found.  Procedures Procedures (including critical care time)  Medications Ordered in ED Medications  cefTRIAXone (ROCEPHIN) injection 250 mg (250 mg Intramuscular Given 08/02/17 2113)  azithromycin (ZITHROMAX) tablet  1,000 mg (1,000 mg Oral Given 08/02/17 2109)  lidocaine (XYLOCAINE) 1 % (with pres) injection (0.9 mLs  Given 08/02/17 2113)     Initial Impression / Assessment and Plan / ED Course  I have reviewed the triage vital signs and the nursing notes.  Pertinent labs & imaging results that were available during my care of the patient were reviewed by me and considered in my medical decision making (see chart for details).     Patient presents with vaginal discharge.  Wet prep negative.  GC chlamydia pending.  Will Rx for cervicitis with  Rocephin 250 mg IM and Zithromax 1 g orally.  Patient encouraged to discuss these findings with her sexual partners.  Final Clinical Impressions(s) / ED Diagnoses   Final diagnoses:  Cervicitis    ED Discharge Orders    None       Donnetta Hutchingook, Muzamil Harker, MD 08/02/17 2207

## 2017-08-02 NOTE — ED Triage Notes (Signed)
Patient c/o vaginal discharge and foul odor x 1 month. Describes it as a watery thick discharge. Took monistat with no relief.

## 2017-08-03 LAB — GC/CHLAMYDIA PROBE AMP (~~LOC~~) NOT AT ARMC
Chlamydia: NEGATIVE
Neisseria Gonorrhea: NEGATIVE

## 2017-10-01 ENCOUNTER — Ambulatory Visit (HOSPITAL_COMMUNITY)
Admission: RE | Admit: 2017-10-01 | Discharge: 2017-10-01 | Disposition: A | Discharge status: Home / Self Care | Payer: No Typology Code available for payment source | Attending: Psychiatry | Admitting: Psychiatry

## 2017-10-01 NOTE — Progress Notes (Signed)
Pt presented to [facility] as a walk-in requesting to detox from opiates. Pt denies SI, HI, and AVH. TTS began the assessment however the pt abuptly stopped the assessment and asked if the pt would be admitted to [facility]. Pt was advised that pt does not meet criteria for inpt hospitalization at [facility] however we would provide resources for SA treatment facilities. Pt then stated she no longer wanted to continue in the assessment because she is not going to be admitted to [facility]. Pt states she is going to try a facility in [facility] and requested to leave.   Princess Bruins, MSW, LCSW Therapeutic Triage Specialist  [phone]

## 2017-10-01 NOTE — H&P (Signed)
Behavioral Health Medical Screening Exam  Emily Logan is an 22 y.o. female who presents to [facility] with her boyfriend. Patient is seeking substance abuse treatment only. Denies SI/HI/AVH. Refuses MSE. No acute distress noted. MSE Refusal document signed.      [name], NP 10/01/2017, 9:51 PM

## 2017-10-25 ENCOUNTER — Ambulatory Visit (HOSPITAL_COMMUNITY): Admission: RE | Admit: 2017-10-25 | Payer: Self-pay | Source: Home / Self Care | Admitting: Psychiatry

## 2017-10-25 ENCOUNTER — Ambulatory Visit (HOSPITAL_COMMUNITY)
Admission: AD | Admit: 2017-10-25 | Discharge: 2017-10-25 | Disposition: A | Discharge status: Home / Self Care | Payer: No Typology Code available for payment source | Attending: Psychiatry | Admitting: Psychiatry

## 2017-10-25 NOTE — BH Assessment (Signed)
1100Pt presented to [facility] as a walk in, she completed form indicating inpatient and detox, reporting vicodin and heroin use. Pt also reporting chest pain, sweating, slurred speech and pain. Writer out to the lobby immediately to assess. Patient then denied chest pain stating she ''had a fall and was sore'' showing bruise to chest area, but didn't know where to go. Informed patient she could be seen for behavioral health crisis screening, and she stated '' I just don't want my drug history used against me for CPS and my kids. '' HIPPA explained in detail. Patient then agreed she would stay to be seen. At 1145 pt called from lobby and had left without being seen.  She did not indicate any SI/HI. NP made aware. At this time, pt does not warrant any safety /wellness check .

## 2017-11-04 ENCOUNTER — Encounter (HOSPITAL_COMMUNITY): Payer: Self-pay | Admitting: Emergency Medicine

## 2017-11-04 ENCOUNTER — Emergency Department (HOSPITAL_COMMUNITY)
Admission: EM | Admit: 2017-11-04 | Discharge: 2017-11-04 | Disposition: A | Discharge status: Home / Self Care | Payer: Self-pay | Attending: Emergency Medicine | Admitting: Emergency Medicine

## 2017-11-04 ENCOUNTER — Other Ambulatory Visit: Payer: Self-pay

## 2017-11-04 DIAGNOSIS — F17200 Nicotine dependence, unspecified, uncomplicated: Secondary | ICD-10-CM | POA: Insufficient documentation

## 2017-11-04 DIAGNOSIS — L03114 Cellulitis of left upper limb: Secondary | ICD-10-CM | POA: Insufficient documentation

## 2017-11-04 DIAGNOSIS — K0889 Other specified disorders of teeth and supporting structures: Secondary | ICD-10-CM | POA: Insufficient documentation

## 2017-11-04 LAB — CBC WITH DIFFERENTIAL/PLATELET
ABS IMMATURE GRANULOCYTES: 0 10*3/uL (ref 0.0–0.1)
Basophils Absolute: 0.1 10*3/uL (ref 0.0–0.1)
Basophils Relative: 1 %
Eosinophils Absolute: 0.2 10*3/uL (ref 0.0–0.7)
Eosinophils Relative: 2 %
HEMATOCRIT: 36.5 % (ref 36.0–46.0)
HEMOGLOBIN: 11.5 g/dL — AB (ref 12.0–15.0)
Immature Granulocytes: 0 %
LYMPHS ABS: 1.7 10*3/uL (ref 0.7–4.0)
LYMPHS PCT: 14 %
MCH: 26.6 pg (ref 26.0–34.0)
MCHC: 31.5 g/dL (ref 30.0–36.0)
MCV: 84.3 fL (ref 78.0–100.0)
MONO ABS: 0.8 10*3/uL (ref 0.1–1.0)
MONOS PCT: 7 %
NEUTROS ABS: 8.9 10*3/uL — AB (ref 1.7–7.7)
Neutrophils Relative %: 76 %
Platelets: 209 10*3/uL (ref 150–400)
RBC: 4.33 MIL/uL (ref 3.87–5.11)
RDW: 13.1 % (ref 11.5–15.5)
WBC: 11.7 10*3/uL — ABNORMAL HIGH (ref 4.0–10.5)

## 2017-11-04 LAB — COMPREHENSIVE METABOLIC PANEL
ALBUMIN: 3.5 g/dL (ref 3.5–5.0)
ALT: 54 U/L (ref 14–54)
ANION GAP: 10 (ref 5–15)
AST: 47 U/L — AB (ref 15–41)
Alkaline Phosphatase: 61 U/L (ref 38–126)
BUN: 6 mg/dL (ref 6–20)
CO2: 26 mmol/L (ref 22–32)
Calcium: 8.8 mg/dL — ABNORMAL LOW (ref 8.9–10.3)
Chloride: 102 mmol/L (ref 101–111)
Creatinine, Ser: 0.84 mg/dL (ref 0.44–1.00)
GFR calc Af Amer: >60 mL/min (ref >60)
GFR calc non Af Amer: >60 mL/min (ref >60)
Glucose, Bld: 123 mg/dL — ABNORMAL HIGH (ref 65–99)
POTASSIUM: 3.5 mmol/L (ref 3.5–5.1)
SODIUM: 138 mmol/L (ref 135–145)
Total Bilirubin: 0.2 mg/dL — ABNORMAL LOW (ref 0.3–1.2)
Total Protein: 6.5 g/dL (ref 6.5–8.1)

## 2017-11-04 LAB — I-STAT CG4 LACTIC ACID, ED: Lactic Acid, Venous: 1.07 mmol/L (ref 0.5–1.9)

## 2017-11-04 MED ORDER — CLINDAMYCIN HCL 150 MG PO CAPS: 450.0000 mg | ORAL_CAPSULE | Freq: Four times a day (QID) | ORAL | 0 refills | Status: AC

## 2017-11-04 NOTE — ED Triage Notes (Signed)
Pt admits to heroin addiction, pt states she just left detox, used yesterday. Pt has an abscess on her left arm.  Arm red, swollen and warm to the touch.

## 2017-11-04 NOTE — Discharge Instructions (Signed)
There is help if you need it.  Please do not use dirty needles, this could cause you a severe infection to your skin, heart or spinal cord.  This could kill you or leave you permanently disabled.   ° °[place] [facility] ([facility]) °Fixed; mobile; peer-based °Chase Holleman °[phone] °[email or web address] °Fixed site exchange at [employer], Wednesdays (2-5pm) and Thursdays (3-8pm). °[address] °[place], [place] [address] °Call or text to arrange mobile and peer exchange, Mondays (1-4pm) and Fridays (4-7pm). °Serving [place] ° °

## 2017-11-04 NOTE — ED Notes (Signed)
ED Provider at bedside. 

## 2017-11-05 NOTE — ED Provider Notes (Addendum)
[facility] Provider Note   CSN: [id number] Arrival date & time: 11/04/17  2024     History   Chief Complaint Chief Complaint  Patient presents with  . Tachycardia  . Drug Problem  . Abscess    HPI Emily Logan is a 22 y.o. female.  22 yo F with a chief complaint of a painful left forearm.  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  She denies any broken needles in there.  Also complaining of left lower dental pain.  Having trouble chewing.  Has been doing warm salt water rinses with some improvement.  Having some swelling to the left jaw.  The history is provided by the patient.  Drug Problem  Pertinent negatives include no chest pain, no headaches and no shortness of breath.  Abscess  Associated symptoms: no fever, no headaches, no nausea and no vomiting   Illness  This is a new problem. The current episode started 2 days ago. The problem occurs constantly. The problem has not changed since onset.Pertinent negatives include no chest pain, no headaches and no shortness of breath. Nothing aggravates the symptoms. Nothing relieves the symptoms. She has tried nothing for the symptoms. The treatment provided no relief.    History reviewed. No pertinent past medical history.  There are no active problems to display for this patient.   History reviewed. No pertinent surgical history.   OB History   None      Home Medications    Prior to Admission medications   Medication Sig Start Date End Date Taking? Authorizing Provider  clindamycin (CLEOCIN) 150 MG capsule Take 3 capsules (450 mg total) by mouth every 6 (six) hours for 10 days. 11/04/17 11/14/17  Melene PlanFloyd, Elfreda Blanchet, DO  traMADol (ULTRAM) 50 MG tablet Take 1 tablet (50 mg total) by mouth every 8 (eight) hours as needed. Patient not taking: Reported on 07/23/2015 10/06/14   Carmelina DaneAnderson, Jeffery S, MD    Family History No family history on file.  Social History Social History     Tobacco Use  . Smoking status: Current Every Day Smoker  . Smokeless tobacco: Never Used  Substance Use Topics  . Alcohol use: No  . Drug use: Yes    Types: IV    Comment: heroin     Allergies   Patient has no known allergies.   Review of Systems Review of Systems  Constitutional: Negative for chills and fever.  HENT: Positive for dental problem. Negative for congestion and rhinorrhea.   Eyes: Negative for redness and visual disturbance.  Respiratory: Negative for shortness of breath and wheezing.   Cardiovascular: Negative for chest pain and palpitations.  Gastrointestinal: Negative for nausea and vomiting.  Genitourinary: Negative for dysuria and urgency.  Musculoskeletal: Negative for arthralgias and myalgias.  Skin: Positive for wound. Negative for pallor.  Neurological: Negative for dizziness and headaches.     Physical Exam Updated Vital Signs BP 108/65   Pulse (!) 110   Temp 98.3 F (36.8 C) (Oral)   Resp 17   Ht 5\' 4"  (1.626 m)   Wt 54.4 kg (120 lb)   SpO2 96%   BMI 20.60 kg/m   Physical Exam  Constitutional: She is oriented to person, place, and time. She appears well-developed and well-nourished. No distress.  HENT:  Head: Normocephalic and atraumatic.  Fracture to the left lower second molar.  No sublingual swelling no focal area of fluctuance.  Eyes: Pupils are equal, round,  and reactive to light. EOM are normal.  Neck: Normal range of motion. Neck supple.  Cardiovascular: Normal rate and regular rhythm. Exam reveals no gallop and no friction rub.  No murmur heard. Pulmonary/Chest: Effort normal. She has no wheezes. She has no rales.  Abdominal: Soft. She exhibits no distension. There is no tenderness.  Musculoskeletal: She exhibits no edema or tenderness.  Erythema to the left dorsal forearm.  No noted fluctuance.  Mildly tender to palpation.  Mildly warm compared to the rest of her arm.  Neurological: She is alert and oriented to person,  place, and time.  Skin: Skin is warm and dry. She is not diaphoretic.  Psychiatric: She has a normal mood and affect. Her behavior is normal.  Nursing note and vitals reviewed.    ED Treatments / Results  Labs (all labs ordered are listed, but only abnormal results are displayed) Labs Reviewed  COMPREHENSIVE METABOLIC PANEL - Abnormal; Notable for the following components:      Result Value   Glucose, Bld 123 (*)    Calcium 8.8 (*)    AST 47 (*)    Total Bilirubin 0.2 (*)    All other components within normal limits  CBC WITH DIFFERENTIAL/PLATELET - Abnormal; Notable for the following components:   WBC 11.7 (*)    Hemoglobin 11.5 (*)    Neutro Abs 8.9 (*)    All other components within normal limits  I-STAT CG4 LACTIC ACID, ED  I-STAT CG4 LACTIC ACID, ED    EKG EKG Interpretation  Date/Time:  Saturday November 04 2017 20:40:36 EDT Ventricular Rate:  117 PR Interval:  114 QRS Duration: 80 QT Interval:  318 QTC Calculation: 443 R Axis:   78 Text Interpretation:  Sinus tachycardia Otherwise normal ECG No significant change since last tracing Confirmed by Melene Plan [phone]) on 11/04/2017 10:35:04 PM   Radiology No results found.  Procedures Procedures (including critical care time)  Medications Ordered in ED Medications - No data to display   Initial Impression / Assessment and Plan / ED Course  I have reviewed the triage vital signs and the nursing notes.  Pertinent labs & imaging results that were available during my care of the patient were reviewed by me and considered in my medical decision making (see chart for details).     22 yo F with a chief complaint of erythema and pain to the left forearm.  Going on for the past few days.  She recently used heroin in that area and thinks it is infected.  Having subjective fevers and chills.  Bedside ultrasound with no focal abscess.  Will start on antibiotics.  She also is complaining of left lower dental pain.  No  fluctuance or abscess.  Given dental follow-up.  Discharge home.  The patient is tachycardic.  She had labs done that showed no acidosis or anion gap.  Her hemoglobin is mildly low but with no significant change.  I feel that the tachycardia is likely related to drug use or anxiety.  Do not feel this needs to be worked up further for PE.  She has no murmurs on heart exam I do not feel that she has endocarditis.  12:16 AM:  I have discussed the diagnosis/risks/treatment options with the patient and believe the pt to be eligible for discharge home to follow-up with PCP, dentist. We also discussed returning to the ED immediately if new or worsening sx occur. We discussed the sx which are most concerning (e.g., sudden worsening  pain, fever, inability to tolerate by mouth) that necessitate immediate return. Medications administered to the patient during their visit and any new prescriptions provided to the patient are listed below.  Medications given during this visit Medications - No data to display    The patient appears reasonably screen and/or stabilized for discharge and I doubt any other medical condition or other [facility] requiring further screening, evaluation, or treatment in the ED at this time prior to discharge.    Final Clinical Impressions(s) / ED Diagnoses   Final diagnoses:  Pain, dental  Cellulitis of left upper extremity    ED Discharge Orders        Ordered    clindamycin (CLEOCIN) 150 MG capsule  Every 6 hours     11/04/17 2327       Melene Plan, DO 11/05/17 0016    Melene Plan, DO 11/05/17 0017

## 2018-04-04 ENCOUNTER — Emergency Department (HOSPITAL_COMMUNITY)
Admission: EM | Admit: 2018-04-04 | Discharge: 2018-04-05 | Disposition: A | Discharge status: Home / Self Care | Payer: Self-pay | Attending: Emergency Medicine | Admitting: Emergency Medicine

## 2018-04-04 ENCOUNTER — Encounter (HOSPITAL_COMMUNITY): Payer: Self-pay | Admitting: Emergency Medicine

## 2018-04-04 DIAGNOSIS — L03114 Cellulitis of left upper limb: Secondary | ICD-10-CM | POA: Insufficient documentation

## 2018-04-04 DIAGNOSIS — L02414 Cutaneous abscess of left upper limb: Secondary | ICD-10-CM

## 2018-04-04 DIAGNOSIS — F17210 Nicotine dependence, cigarettes, uncomplicated: Secondary | ICD-10-CM | POA: Insufficient documentation

## 2018-04-04 LAB — I-STAT BETA HCG BLOOD, ED (MC, WL, AP ONLY): I-stat hCG, quantitative: <5 m[IU]/mL (ref <5)

## 2018-04-04 LAB — I-STAT CG4 LACTIC ACID, ED: LACTIC ACID, VENOUS: 0.65 mmol/L (ref 0.5–1.9)

## 2018-04-04 MED ORDER — LIDOCAINE-EPINEPHRINE (PF) 2 %-1:200000 IJ SOLN
20.0000 mL | Freq: Once | INTRAMUSCULAR | Status: AC
  Administered 2018-04-05: 20 mL
  Filled 2018-04-04: qty 20

## 2018-04-04 MED ORDER — CLINDAMYCIN HCL 150 MG PO CAPS
450.0000 mg | ORAL_CAPSULE | Freq: Once | ORAL | Status: AC
Start: 2018-04-05 — End: 2018-04-04
  Administered 2018-04-04: 450 mg via ORAL
  Filled 2018-04-04: qty 3

## 2018-04-04 NOTE — ED Provider Notes (Signed)
[facility] EMERGENCY DEPARTMENT Provider Note   CSN: [id number] Arrival date & time: 04/04/18  2204     History   Chief Complaint Chief Complaint  Patient presents with  . 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  Reports last injected in this site on 10/21. Since she has noticed a small knot that has significantly gotten larger in last 2 days. She has put warm water on it.  Associated with pain, redness.  No fevers, chills, elbow pain.  H/o abscesses requiring surgical drainage.  Aggravated by palpation. No alleviating factors.   HPI  History reviewed. No pertinent past medical history.  There are no active problems to display for this patient.   History reviewed. No pertinent surgical history.   OB History   None      Home Medications    Prior to Admission medications   Medication Sig Start Date End Date Taking? Authorizing Provider  clindamycin (CLEOCIN) 150 MG capsule Take 3 capsules (450 mg total) by mouth every 6 (six) hours for 10 days. 04/05/18 04/15/18  Liberty Handy, PA-C  naproxen (NAPROSYN) 500 MG tablet Take 1 tablet (500 mg total) by mouth 2 (two) times daily. 04/05/18   Liberty Handy, PA-C  traMADol (ULTRAM) 50 MG tablet Take 1 tablet (50 mg total) by mouth every 8 (eight) hours as needed. Patient not taking: Reported on 07/23/2015 10/06/14   Carmelina Dane, MD    Family History History reviewed. No pertinent family history.  Social History Social History   Tobacco Use  . Smoking status: Current Every Day Smoker  . Smokeless tobacco: Never Used  Substance Use Topics  . Alcohol use: No  . Drug use: Yes    Types: IV    Comment: heroin     Allergies   Patient has no known allergies.   Review of Systems Review of Systems  Skin: Positive for color change.       abscess  All other systems reviewed and are  negative.    Physical Exam Updated Vital Signs BP 121/70 (BP Location: Right Arm)   Pulse 97   Temp 98.9 F (37.2 C) (Oral)   Resp 18   SpO2 100%   Physical Exam  Constitutional: She is oriented to person, place, and time. She appears well-developed and well-nourished.  Non-toxic appearance.  HENT:  Head: Normocephalic.  Right Ear: External ear normal.  Left Ear: External ear normal.  Nose: Nose normal.  Eyes: Conjunctivae and EOM are normal.  Neck: Full passive range of motion without pain.  Cardiovascular: Normal rate.  Pulmonary/Chest: Effort normal. No tachypnea. No respiratory distress.  Musculoskeletal: Normal range of motion.  No focal bony tenderness, erythema, edema, warmth to left elbow, full ROM of elbow w/o pain. No pain with supination or pronation of elbow.   Neurological: She is alert and oriented to person, place, and time.  Skin: Skin is warm and dry. Capillary refill takes less than 2 seconds.  approx 5 x 5 area of induration with central fluctuance to left proximal forearm, slight erythema streaking away from it to dorsal and ventral forearm.  Track mark noted over this area. Scars noted to left distal forearm and right dorsal hand.   Psychiatric: Her behavior is normal. Thought content normal.         ED Treatments / Results  Labs (all labs ordered  are listed, but only abnormal results are displayed) Labs Reviewed  CBC WITH DIFFERENTIAL/PLATELET - Abnormal; Notable for the following components:      Result Value   Hemoglobin 11.5 (*)    MCH 25.8 (*)    Eosinophils Absolute 0.9 (*)    All other components within normal limits  COMPREHENSIVE METABOLIC PANEL - Abnormal; Notable for the following components:   BUN 5 (*)    All other components within normal limits  I-STAT CG4 LACTIC ACID, ED  I-STAT BETA HCG BLOOD, ED ([facility], [facility], [facility] ONLY)  I-STAT CG4 LACTIC ACID, ED    EKG None  Radiology No results found.  Procedures .Marland KitchenIncision and  Drainage Date/Time: 04/05/2018 1:20 AM Performed by: Liberty Handy, PA-C Authorized by: Liberty Handy, PA-C   Consent:    Consent obtained:  Verbal   Consent given by:  Patient   Risks discussed:  Bleeding, incomplete drainage, infection and pain   Alternatives discussed:  Alternative treatment Location:    Type:  Abscess   Size:  5x5    Location:  Upper extremity   Upper extremity location:  Arm   Arm location:  L lower arm Pre-procedure details:    Procedure prep: chlorohexidine. Anesthesia (see MAR for exact dosages):    Anesthesia method:  Local infiltration   Local anesthetic:  Lidocaine 2% WITH epi Procedure type:    Complexity:  Simple Procedure details:    Incision types:  Single straight   Incision depth:  Subcutaneous   Scalpel blade:  11   Wound management:  Probed and deloculated, irrigated with saline and extensive cleaning (50 cc NS)   Drainage:  Purulent   Drainage amount:  Copious   Wound treatment:  Wound left open   Packing materials:  None Post-procedure details:    Patient tolerance of procedure:  Tolerated well, no immediate complications   (including critical care time)  EMERGENCY DEPARTMENT US SOFT TISSUE INTERPRETATION "Study: Limited Soft Tissue Ultrasound"  INDICATIONS: Pain and swelling Multiple views of the body part were obtained in real-time with a multi-frequency linear probe  PERFORMED BY: Myself IMAGES ARCHIVED?: Yes SIDE:Left BODY PART:Upper extremity INTERPRETATION:  Cellulitis present and collection of fluid and loculations    Medications Ordered in ED Medications  lidocaine-EPINEPHrine (XYLOCAINE W/EPI) 2 %-1:200000 (PF) injection 20 mL (20 mLs Infiltration Given 04/05/18 0000)  clindamycin (CLEOCIN) capsule 450 mg (450 mg Oral Given 04/04/18 2358)     Initial Impression / Assessment and Plan / ED Course  I have reviewed the triage vital signs and the nursing notes.  Pertinent labs & imaging results that were  available during my care of the patient were reviewed by me and considered in my medical decision making (see chart for details).     Concern for subcutaneous abscess.  [place] confirmed loculations of fluid and cellulitis.  Afebrile. WBC and lactic acid normal.  I&D performed in ER with copious amounts of purulent drainage. Cavity was deloculated, irrigated with normal saline.  There was significant improvement in induration and swelling after procedure.  No expansion of cellulitis to nearby joints. Elbow non tender, no signs of septic arthritis. Had lengthy discussion regarding high risk of worsening infection. She goes to substance abuse center but MD there cannot see her til next week. Instructed pt return to ER in 48 hours for wound re-check.  Will discharge with clindamycin, naproxen, aggressive moist heat therapy.  Provided education of risks of IVDU. She was open with discussion but not interested  in cessation.  Strict return precautions given.   Final Clinical Impressions(s) / ED Diagnoses   Final diagnoses:  Abscess of forearm, left    ED Discharge Orders         Ordered    clindamycin (CLEOCIN) 150 MG capsule  Every 6 hours     04/05/18 0051    naproxen (NAPROSYN) 500 MG tablet  2 times daily     04/05/18 0051           Liberty Handy, PA-C 04/05/18 0124    Tegeler, Canary Brim, MD 04/05/18 [phone]

## 2018-04-04 NOTE — ED Triage Notes (Signed)
Pt presents with abscess to L forearm since mid-October but now more painful after soaking in hot water, no drainage currently

## 2018-04-05 LAB — COMPREHENSIVE METABOLIC PANEL
ALBUMIN: 3.7 g/dL (ref 3.5–5.0)
ALK PHOS: 87 U/L (ref 38–126)
ALT: 18 U/L (ref 0–44)
ANION GAP: 8 (ref 5–15)
AST: 22 U/L (ref 15–41)
BILIRUBIN TOTAL: 0.5 mg/dL (ref 0.3–1.2)
BUN: 5 mg/dL — AB (ref 6–20)
CALCIUM: 9.2 mg/dL (ref 8.9–10.3)
CO2: 26 mmol/L (ref 22–32)
Chloride: 101 mmol/L (ref 98–111)
Creatinine, Ser: 0.7 mg/dL (ref 0.44–1.00)
GFR calc Af Amer: >60 mL/min (ref >60)
GFR calc non Af Amer: >60 mL/min (ref >60)
GLUCOSE: 97 mg/dL (ref 70–99)
POTASSIUM: 4 mmol/L (ref 3.5–5.1)
SODIUM: 135 mmol/L (ref 135–145)
TOTAL PROTEIN: 7.4 g/dL (ref 6.5–8.1)

## 2018-04-05 LAB — CBC WITH DIFFERENTIAL/PLATELET
Abs Immature Granulocytes: 0.01 10*3/uL (ref 0.00–0.07)
BASOS PCT: 1 %
Basophils Absolute: 0.1 10*3/uL (ref 0.0–0.1)
EOS ABS: 0.9 10*3/uL — AB (ref 0.0–0.5)
Eosinophils Relative: 11 %
HEMATOCRIT: 37.5 % (ref 36.0–46.0)
HEMOGLOBIN: 11.5 g/dL — AB (ref 12.0–15.0)
IMMATURE GRANULOCYTES: 0 %
LYMPHS ABS: 3 10*3/uL (ref 0.7–4.0)
Lymphocytes Relative: 35 %
MCH: 25.8 pg — ABNORMAL LOW (ref 26.0–34.0)
MCHC: 30.7 g/dL (ref 30.0–36.0)
MCV: 84.1 fL (ref 80.0–100.0)
MONO ABS: 0.8 10*3/uL (ref 0.1–1.0)
MONOS PCT: 9 %
Neutro Abs: 3.9 10*3/uL (ref 1.7–7.7)
Neutrophils Relative %: 44 %
Platelets: 188 10*3/uL (ref 150–400)
RBC: 4.46 MIL/uL (ref 3.87–5.11)
RDW: 14.1 % (ref 11.5–15.5)
WBC: 8.7 10*3/uL (ref 4.0–10.5)
nRBC: 0 % (ref 0.0–0.2)

## 2018-04-05 MED ORDER — CLINDAMYCIN HCL 150 MG PO CAPS: 450.0000 mg | ORAL_CAPSULE | Freq: Four times a day (QID) | ORAL | 0 refills | Status: AC

## 2018-04-05 MED ORDER — NAPROXEN 500 MG PO TABS: 500.0000 mg | ORAL_TABLET | Freq: Two times a day (BID) | ORAL | 0 refills | Status: DC

## 2018-04-05 NOTE — Discharge Instructions (Addendum)
You have a abscess. This is a collection of pus.  This was drained.    Treatment includes antibiotics and moist heat therapy.  Take antibiotic as prescribed and until completed. Symptoms typically improve in 48-72 hours. Apply moist heat (warm towel, heating pad) or massage under warm water or heating pad at least twice a day to help drainage.   Any abscess can worsen, enlarge and spread infection into blood stream.  Return to the ER if you have fevers, chills, worsening swelling, redness, warmth.   Return to ER on Saturday for wound check and to ensure infection is responding to antibiotics.

## 2018-04-07 ENCOUNTER — Encounter (HOSPITAL_COMMUNITY): Payer: Self-pay | Admitting: Emergency Medicine

## 2018-04-07 ENCOUNTER — Emergency Department (HOSPITAL_COMMUNITY)
Admission: EM | Admit: 2018-04-07 | Discharge: 2018-04-07 | Disposition: A | Discharge status: Home / Self Care | Payer: Self-pay | Attending: Emergency Medicine | Admitting: Emergency Medicine

## 2018-04-07 DIAGNOSIS — F17200 Nicotine dependence, unspecified, uncomplicated: Secondary | ICD-10-CM | POA: Insufficient documentation

## 2018-04-07 DIAGNOSIS — L02414 Cutaneous abscess of left upper limb: Secondary | ICD-10-CM | POA: Insufficient documentation

## 2018-04-07 DIAGNOSIS — Z5189 Encounter for other specified aftercare: Secondary | ICD-10-CM

## 2018-04-07 NOTE — ED Triage Notes (Signed)
Reports having I&D to left forearm 48 hours ago.  Here for recheck.

## 2018-04-07 NOTE — ED Provider Notes (Signed)
[facility] EMERGENCY DEPARTMENT Provider Note   CSN: [id number] Arrival date & time: 04/07/18  0059     History   Chief Complaint Chief Complaint  Patient presents with  . Wound Check    HPI Emily Logan is a 22 y.o. female.  Patient returns for 48-hour follow-up after incision and drainage of abscess on left forearm.  Patient reports that she was told to come back in 2 days for recheck.  She has had some increased pain but no drainage.     History reviewed. No pertinent past medical history.  There are no active problems to display for this patient.   History reviewed. No pertinent surgical history.   OB History   None      Home Medications    Prior to Admission medications   Medication Sig Start Date End Date Taking? Authorizing Provider  clindamycin (CLEOCIN) 150 MG capsule Take 3 capsules (450 mg total) by mouth every 6 (six) hours for 10 days. 04/05/18 04/15/18  Liberty Handy, PA-C  naproxen (NAPROSYN) 500 MG tablet Take 1 tablet (500 mg total) by mouth 2 (two) times daily. 04/05/18   Liberty Handy, PA-C  traMADol (ULTRAM) 50 MG tablet Take 1 tablet (50 mg total) by mouth every 8 (eight) hours as needed. Patient not taking: Reported on 07/23/2015 10/06/14   Carmelina Dane, MD    Family History No family history on file.  Social History Social History   Tobacco Use  . Smoking status: Current Every Day Smoker  . Smokeless tobacco: Never Used  Substance Use Topics  . Alcohol use: No  . Drug use: Yes    Types: IV    Comment: heroin     Allergies   Patient has no known allergies.   Review of Systems Review of Systems  Constitutional: Negative for fever.  Skin: Positive for wound.     Physical Exam Updated Vital Signs BP 121/62 (BP Location: Right Arm)   Pulse 93   Temp 98.9 F (37.2 C) (Oral)   Resp 14   Ht 5\' 1"  (1.549 m)   Wt 54.4 kg   SpO2 99%   BMI 22.67 kg/m   Physical Exam  Constitutional:  She appears well-developed.  Eyes: Pupils are equal, round, and reactive to light.  Cardiovascular: Normal rate.  Pulmonary/Chest: Effort normal and breath sounds normal.  Musculoskeletal: Normal range of motion.  Neurological: She is alert.  Skin:  I&D site volar aspect of left forearm -no erythema, no induration, no drainage.  No signs of active infection at this time.     ED Treatments / Results  Labs (all labs ordered are listed, but only abnormal results are displayed) Labs Reviewed - No data to display  EKG None  Radiology No results found.  Procedures Procedures (including critical care time)  Medications Ordered in ED Medications - No data to display   Initial Impression / Assessment and Plan / ED Course  I have reviewed the triage vital signs and the nursing notes.  Pertinent labs & imaging results that were available during my care of the patient were reviewed by me and considered in my medical decision making (see chart for details).     Patient returns for scheduled follow-up after I&D.  Wound looks very good, no sign of any cellulitis or ongoing drainage.  Wound rewrapped, patient counseled that she needs to perform daily wound care and only follow-up if wound worsens.  Final Clinical Impressions(s) /  ED Diagnoses   Final diagnoses:  Wound check, abscess    ED Discharge Orders    None       Pollina, Canary Brim, MD 04/07/18 [phone]

## 2018-04-07 NOTE — ED Notes (Signed)
E-signature not available, pt verbalized understanding of DC instructions  

## 2018-10-18 ENCOUNTER — Other Ambulatory Visit: Payer: Self-pay

## 2018-10-18 ENCOUNTER — Emergency Department (HOSPITAL_COMMUNITY)
Admission: EM | Admit: 2018-10-18 | Discharge: 2018-10-18 | Disposition: A | Discharge status: Home / Self Care | Payer: Self-pay | Attending: Emergency Medicine | Admitting: Emergency Medicine

## 2018-10-18 ENCOUNTER — Encounter (HOSPITAL_COMMUNITY): Payer: Self-pay | Admitting: Emergency Medicine

## 2018-10-18 DIAGNOSIS — K029 Dental caries, unspecified: Secondary | ICD-10-CM | POA: Insufficient documentation

## 2018-10-18 DIAGNOSIS — F1920 Other psychoactive substance dependence, uncomplicated: Secondary | ICD-10-CM | POA: Insufficient documentation

## 2018-10-18 DIAGNOSIS — K0889 Other specified disorders of teeth and supporting structures: Secondary | ICD-10-CM | POA: Insufficient documentation

## 2018-10-18 DIAGNOSIS — F17200 Nicotine dependence, unspecified, uncomplicated: Secondary | ICD-10-CM | POA: Insufficient documentation

## 2018-10-18 DIAGNOSIS — F1410 Cocaine abuse, uncomplicated: Secondary | ICD-10-CM | POA: Insufficient documentation

## 2018-10-18 MED ORDER — AMOXICILLIN 500 MG PO CAPS
500.0000 mg | ORAL_CAPSULE | Freq: Once | ORAL | Status: AC
  Administered 2018-10-18: 500 mg via ORAL
  Filled 2018-10-18: qty 1

## 2018-10-18 MED ORDER — TRAMADOL HCL 50 MG PO TABS
50.0000 mg | ORAL_TABLET | Freq: Once | ORAL | Status: AC
  Administered 2018-10-18: 50 mg via ORAL
  Filled 2018-10-18: qty 1

## 2018-10-18 MED ORDER — DICLOFENAC SODIUM 75 MG PO TBEC
75.0000 mg | DELAYED_RELEASE_TABLET | Freq: Once | ORAL | Status: AC
  Administered 2018-10-18: 75 mg via ORAL
  Filled 2018-10-18: qty 1

## 2018-10-18 MED ORDER — DICLOFENAC SODIUM 75 MG PO TBEC: 75.0000 mg | DELAYED_RELEASE_TABLET | Freq: Two times a day (BID) | ORAL | 0 refills | Status: DC

## 2018-10-18 MED ORDER — AMOXICILLIN 500 MG PO CAPS: 500.0000 mg | ORAL_CAPSULE | Freq: Three times a day (TID) | ORAL | 0 refills | Status: DC

## 2018-10-18 NOTE — ED Notes (Signed)
Discharge instructions and prescriptions discussed with Pt. Pt verbalized understanding. Pt stable and ambulatory.   

## 2018-10-18 NOTE — Discharge Instructions (Signed)
Follow-up with a dentist.  Only a dentist can fix your problem.  We recommend that you take amoxicillin as prescribed to treat likely underlying infection.  Take diclofenac as prescribed for pain control.  Return to the ED for any new or concerning symptoms.

## 2018-10-18 NOTE — ED Triage Notes (Signed)
Patient here with dental pain, with pain on the lower and upper jaw on the left side.  Patient states she has never had this much pain in her life.  She states she was not able to take anything for pain because of the pain.

## 2018-10-18 NOTE — ED Provider Notes (Signed)
[facility]   CSN: [id number] Arrival date & time: 10/18/18  2032    History   Chief Complaint Chief Complaint  Patient presents with  . Dental Pain    HPI Emily StarrHeather R Logan is a 23 y.o. female.     The history is provided by the patient. No language interpreter was used.  Dental Pain  Location:  Upper and lower Upper teeth location:  14/LU 1st molar Lower teeth location:  19/LL 1st molar Quality:  Aching and sharp Onset quality:  Gradual Duration:  2 months Timing:  Intermittent Progression:  Worsening Chronicity:  Chronic Context: dental caries and poor dentition   Relieved by:  Nothing Ineffective treatments: hydrogen peroxide swishes. Associated symptoms: facial pain   Associated symptoms: no fever and no trismus   Risk factors: lack of dental care     History reviewed. No pertinent past medical history.  There are no active problems to display for this patient.   History reviewed. No pertinent surgical history.   OB History   No obstetric history on file.      Home Medications    Prior to Admission medications   Medication Sig Start Date End Date Taking? Authorizing Provider  amoxicillin (AMOXIL) 500 MG capsule Take 1 capsule (500 mg total) by mouth 3 (three) times daily. 10/18/18   Antony MaduraHumes, Elayne Gruver, PA-C  diclofenac (VOLTAREN) 75 MG EC tablet Take 1 tablet (75 mg total) by mouth 2 (two) times daily. 10/18/18   Antony MaduraHumes, Erendira Crabtree, PA-C    Family History No family history on file.  Social History Social History   Tobacco Use  . Smoking status: Current Every Day Smoker  . Smokeless tobacco: Never Used  Substance Use Topics  . Alcohol use: No  . Drug use: Yes    Types: IV, Cocaine    Comment: heroin     Allergies   Patient has no known allergies.   Review of Systems Review of Systems  Constitutional: Negative for fever.  HENT: Positive for dental problem.   Ten systems reviewed and are  negative for acute change, except as noted in the HPI.    Physical Exam Updated Vital Signs BP 117/70   Pulse 83   Temp 100.2 F (37.9 C) (Oral)   Resp 16   Ht 5\' 3"  (1.6 m)   LMP 09/18/2018 (Approximate)   SpO2 97%   BMI 21.26 kg/m   Physical Exam Vitals signs and nursing note reviewed.  Constitutional:      General: She is not in acute distress.    Appearance: She is well-developed. She is not diaphoretic.     Comments: Sleeping on initial presentation to room. Once awake, tearful.  HENT:     Head: Normocephalic and atraumatic.     Mouth/Throat:     Mouth: Mucous membranes are moist.      Comments: Soft oral floor. Tolerating secretions. No facial swelling or trismus. No gingival swelling or purulent drainage. Eyes:     General: No scleral icterus.    Conjunctiva/sclera: Conjunctivae normal.  Neck:     Musculoskeletal: Normal range of motion.     Comments: No meningismus Pulmonary:     Effort: Pulmonary effort is normal. No respiratory distress.     Comments: Respirations even and unlabored Musculoskeletal: Normal range of motion.  Skin:    General: Skin is warm and dry.     Coloration: Skin is not pale.     Findings: No erythema or  rash.  Neurological:     Mental Status: She is alert and oriented to person, place, and time.  Psychiatric:        Behavior: Behavior normal.      ED Treatments / Results  Labs (all labs ordered are listed, but only abnormal results are displayed) Labs Reviewed - No data to display  EKG None  Radiology No results found.  Procedures Procedures (including critical care time)  Medications Ordered in ED Medications  diclofenac (VOLTAREN) EC tablet 75 mg (has no administration in time range)  traMADol (ULTRAM) tablet 50 mg (has no administration in time range)  amoxicillin (AMOXIL) capsule 500 mg (has no administration in time range)     Initial Impression / Assessment and Plan / ED Course  I have reviewed the triage  vital signs and the nursing notes.  Pertinent labs & imaging results that were available during my care of the patient were reviewed by me and considered in my medical decision making (see chart for details).        Patient with toothache.  Symptoms ongoing x 2 months, worse in the past few days.  No gross abscess.  Exam unconcerning for Ludwig's angina or spread of infection.  Will treat with amoxicillin and diclofenac.  No narcotics prescribed given hx of narcotic abuse and overdose.  Urged patient to follow-up with dentist.  Return precautions discussed and provided. Patient discharged in stable condition with no unaddressed concerns.   Final Clinical Impressions(s) / ED Diagnoses   Final diagnoses:  Pain, dental  Dental caries    ED Discharge Orders         Ordered    diclofenac (VOLTAREN) 75 MG EC tablet  2 times daily     10/18/18 2217    amoxicillin (AMOXIL) 500 MG capsule  3 times daily     10/18/18 2217           Antony Madura, Cordelia Poche 10/18/18 2224    Loren Racer, MD 10/20/18 [phone]

## 2019-01-07 ENCOUNTER — Ambulatory Visit (HOSPITAL_COMMUNITY)
Admission: RE | Admit: 2019-01-07 | Discharge: 2019-01-07 | Disposition: A | Discharge status: Home / Self Care | Payer: Medicaid Other | Attending: Psychiatry | Admitting: Psychiatry

## 2019-01-07 DIAGNOSIS — F17200 Nicotine dependence, unspecified, uncomplicated: Secondary | ICD-10-CM | POA: Insufficient documentation

## 2019-01-07 DIAGNOSIS — F1910 Other psychoactive substance abuse, uncomplicated: Secondary | ICD-10-CM | POA: Insufficient documentation

## 2019-01-08 ENCOUNTER — Ambulatory Visit (HOSPITAL_COMMUNITY)
Admission: RE | Admit: 2019-01-08 | Discharge: 2019-01-08 | Disposition: A | Discharge status: Home / Self Care | Payer: Medicaid Other | Attending: Psychiatry | Admitting: Psychiatry

## 2019-01-08 DIAGNOSIS — F17200 Nicotine dependence, unspecified, uncomplicated: Secondary | ICD-10-CM | POA: Insufficient documentation

## 2019-01-08 DIAGNOSIS — F1120 Opioid dependence, uncomplicated: Secondary | ICD-10-CM | POA: Insufficient documentation

## 2019-01-08 NOTE — H&P (Signed)
Behavioral Health Medical Screening Exam  Emily Logan is an 23 y.o. female.  Total Time spent with patient: 20 minutes  Psychiatric Specialty Exam: Physical Exam  Constitutional: She is oriented to person, place, and time. She appears well-developed and well-nourished. No distress.  HENT:  Head: Normocephalic and atraumatic.  Right Ear: External ear normal.  Left Ear: External ear normal.  Eyes: Pupils are equal, round, and reactive to light. Right eye exhibits no discharge. Left eye exhibits no discharge.  Respiratory: Effort normal. No respiratory distress.  Musculoskeletal: Normal range of motion.  Neurological: She is alert and oriented to person, place, and time.  Skin: She is not diaphoretic.  Psychiatric: Her speech is normal. Her mood appears anxious. Thought content is not paranoid and not delusional. She expresses impulsivity and inappropriate judgment. She does not exhibit a depressed mood. She expresses no homicidal and no suicidal ideation.    Review of Systems  Constitutional: Negative for chills, diaphoresis, fever, malaise/fatigue and weight loss.  Respiratory: Negative for cough and shortness of breath.   Cardiovascular: Negative for chest pain.  Gastrointestinal: Negative for diarrhea, nausea and vomiting.  Psychiatric/Behavioral: Positive for substance abuse. Negative for depression, hallucinations, memory loss and suicidal ideas. The patient is nervous/anxious and has insomnia.     There were no vitals taken for this visit.There is no height or weight on file to calculate BMI.  General Appearance: Casual and Well Groomed  Eye Contact:  Good  Speech:  Clear and Coherent and Normal Rate  Volume:  Normal  Mood:  Anxious  Affect:  Congruent  Thought Process:  Coherent, Goal Directed and Descriptions of Associations: Intact  Orientation:  Full (Time, Place, and Person)  Thought Content:  Logical  Suicidal Thoughts:  No  Homicidal Thoughts:  No  Memory:   Immediate;   Good Recent;   Good  Judgement:  Fair  Insight:  Fair  Psychomotor Activity:  Normal  Concentration: Concentration: Good and Attention Span: Good  Recall:  Good  Fund of Knowledge:Good  Language: Negative  Akathisia:  Negative  Handed:  Right  AIMS (if indicated):     Assets:  Communication Skills Desire for Improvement Housing Intimacy Leisure Time Physical Health  Sleep:       Musculoskeletal: Strength & Muscle Tone: within normal limits Gait & Station: normal Patient leans: N/A  There were no vitals taken for this visit.  Recommendations:  Based on my evaluation the patient does not appear to have an emergency medical condition.  Disposition: No evidence of imminent risk to self or others at present.   Patient does not meet criteria for psychiatric inpatient admission. Supportive therapy provided about ongoing stressors. Discussed crisis plan, support from social network, calling 911, coming to the Emergency Department, and calling Suicide Hotline.  Rozetta Nunnery, NP 01/08/2019, 5:16 AM

## 2019-01-08 NOTE — BH Assessment (Signed)
Assessment Note  Emily Logan is an 23 y.o. female.  -Patient was brought to [facility] by a friend in a private vehicle.  Her friend did not participate in the assessment process.  Patient came to [facility] wanting to be detoxed from heroin.  She said that she uses heroin and crack cocaine.  Patient said that she uses a varying amount of heroin IV daily for about the last year.  Her last use was around 19:30 tonight.  She said she uses crack about every other day and last use was today.  Patient denies any SI, HI or A/V hallucinations.  She is depressed and has guilty feelings about her drug abuse.  She denies use of ETOH.    Patient is restless during interview, tapping feet and playing w/ her hair.  She answers questions without much elaboration.  Patient displays some anxiety.  Her thought content and mood are congruent.  Patient says she was at [facility] in June 2019 for  SA issues.  She was admitted to [facility] ([place]) in 04/2018 and 02/2018.  She was getting outpatient services for a year at [facility] and last time there was 6 months ago.  -Clinician discussed patient care with Lindon Romp, FNP who completed the MSE.  He did not recommend inpatient care for patient.  Clinician did provide outpatient resources for patient.  Patient said she was going to follow up with [facility] and [facility] tomorrow.(08/04).  Patient left with the friend that brought her.  Diagnosis: Polysubstance abuse  Past Medical History: No past medical history on file.  No past surgical history on file.  Family History: No family history on file.  Social History:  reports that she has been smoking. She has never used smokeless tobacco. She reports current drug use. Drugs: IV and Cocaine. She reports that she does not drink alcohol.  Additional Social History:  Alcohol / Drug Use Pain Medications: Abusing heroin Prescriptions: None Over the Counter: None History of alcohol / drug use?: Yes Withdrawal Symptoms:  Patient aware of relationship between substance abuse and physical/medical complications, Weakness, Tremors, Fever / Chills, Agitation Substance #1 Name of Substance 1: Heroin IV 1 - Age of First Use: 23 years of age 41 - Amount (size/oz): Varies 1 - Frequency: Daily 1 - Duration: Daily for about 18 months 1 - Last Use / Amount: 08/03 around 19:30 used half a gram Substance #2 Name of Substance 2: Crack cocaine 2 - Age of First Use: 23 years of age 69 - Amount (size/oz): $20-30 worth every other day 2 - Frequency: Every other day 2 - Duration: Last 6 months 2 - Last Use / Amount: 08/03  CIWA:   COWS:    Allergies: No Known Allergies  Home Medications: (Not in a hospital admission)   OB/GYN Status:  No LMP recorded. (Menstrual status: Irregular Periods).  General Assessment Data Location of Assessment: [facility] Assessment Services TTS Assessment: In system Is this a Tele or Face-to-Face Assessment?: Face-to-Face Is this an Initial Assessment or a Re-assessment for this encounter?: Initial Assessment Patient Accompanied by:: N/A Language Other than English: No Living Arrangements: Other (Comment)(Lives with mother) What gender do you identify as?: Female Marital status: Single Pregnancy Status: No Living Arrangements: (Lives with mother) Can pt return to current living arrangement?: Yes Admission Status: Voluntary Is patient capable of signing voluntary admission?: Yes Referral Source: Self/Family/Friend Insurance type: self pay  Medical Screening Exam ([place]) Medical Exam completed: Marcial Pacas, [place])  Crisis  Care Plan Living Arrangements: (Lives with mother) Name of Psychiatrist: None Name of Therapist: None  Education Status Is patient currently in school?: No Is the patient employed, unemployed or receiving disability?: Unemployed  Risk to self with the past 6 months Suicidal Ideation: No Has patient been a risk to self within the past 6 months prior  to admission? : No Suicidal Intent: No Has patient had any suicidal intent within the past 6 months prior to admission? : No Is patient at risk for suicide?: No Suicidal Plan?: No Has patient had any suicidal plan within the past 6 months prior to admission? : No Access to Means: No What has been your use of drugs/alcohol within the last 12 months?: Heroin and cocaine Previous Attempts/Gestures: No How many times?: 0 Other Self Harm Risks: SA issues Triggers for Past Attempts: None known Intentional Self Injurious Behavior: None Family Suicide History: No Recent stressful life event(s): Turmoil (Comment) Persecutory voices/beliefs?: Yes Depression: Yes Depression Symptoms: Despondent, Isolating, Guilt, Loss of interest in usual pleasures, Feeling worthless/self pity Substance abuse history and/or treatment for substance abuse?: Yes Suicide prevention information given to non-admitted patients: Not applicable  Risk to Others within the past 6 months Homicidal Ideation: No Does patient have any lifetime risk of violence toward others beyond the six months prior to admission? : No Thoughts of Harm to Others: No Current Homicidal Intent: No Current Homicidal Plan: No Access to Homicidal Means: No Identified Victim: No one History of harm to others?: Yes Assessment of Violence: In distant past Violent Behavior Description: No fights since high school Does patient have access to weapons?: No Criminal Charges Pending?: Yes Describe Pending Criminal Charges: Obtaining property by false pretense Does patient have a court date: Yes Court Date: 01/10/19 Is patient on probation?: No  Psychosis Hallucinations: None noted Delusions: None noted  Mental Status Report Appearance/Hygiene: Unremarkable Eye Contact: Fair Motor Activity: Freedom of movement, Restlessness Speech: Logical/coherent Level of Consciousness: Alert, Restless Mood: Anxious, Depressed, Helpless Affect: Anxious,  Apprehensive Anxiety Level: Moderate Thought Processes: Coherent, Relevant Judgement: Impaired Orientation: Person, Place, Situation, Time Obsessive Compulsive Thoughts/Behaviors: None  Cognitive Functioning Concentration: Poor Memory: Remote Intact, Recent Intact Is patient IDD: No Insight: Fair Impulse Control: Poor Appetite: Fair Have you had any weight changes? : Loss Amount of the weight change? (lbs): 30 lbs Sleep: No Change Total Hours of Sleep: 8(8-14 depending on drug use.) Vegetative Symptoms: Staying in bed, Decreased grooming  ADLScreening [facility] Patient's cognitive ability adequate to safely complete daily activities?: Yes Patient able to express need for assistance with ADLs?: Yes Independently performs ADLs?: Yes (appropriate for developmental age)  Prior Inpatient Therapy Prior Inpatient Therapy: Yes Prior Therapy Dates: 11/2017; 02/2018 & 04/2018 Prior Therapy Facilty/Provider(s): [facility]; [facility] Reason for Treatment: SA  Prior Outpatient Therapy Prior Outpatient Therapy: Yes Prior Therapy Dates: 2019 Prior Therapy Facilty/Provider(s): [facility] Reason for Treatment: outpaitient  Does patient have an ACCT team?: No Does patient have Intensive In-House Services?  : No Does patient have [facility] services? : No Does patient have P4CC services?: No  ADL Screening (condition at time of admission) Patient's cognitive ability adequate to safely complete daily activities?: Yes Is the patient deaf or have difficulty hearing?: No Does the patient have difficulty seeing, even when wearing glasses/contacts?: No Does the patient have difficulty concentrating, remembering, or making decisions?: Yes Patient able to express need for assistance with ADLs?: Yes Does the patient have difficulty dressing or bathing?: No Independently performs ADLs?: Yes (appropriate for  developmental age) Does the patient have difficulty walking or climbing stairs?: No Weakness  of Legs: None       Abuse/Neglect Assessment (Assessment to be complete while patient is alone) Abuse/Neglect Assessment Can Be Completed: Yes Physical Abuse: Denies Verbal Abuse: Denies Sexual Abuse: Denies Exploitation of patient/patient's resources: Denies Self-Neglect: Denies     Merchant navy officerAdvance Directives (For Healthcare) Does Patient Have a Medical Advance Directive?: No Would patient like information on creating a medical advance directive?: No - Patient declined          Disposition:  Disposition Initial Assessment Completed for this Encounter: Yes Disposition of Patient: Discharge Patient refused recommended treatment: No Mode of transportation if patient is discharged/movement?: Car Patient referred to: Other (Comment)(Pt given multiple outpatient resources)  On Site Evaluation by:   Reviewed with Physician:    Beatriz StallionHarvey, Gracieann Stannard Ray 01/08/2019 12:05 AM

## 2019-01-08 NOTE — H&P (Addendum)
Behavioral Health Medical Screening Exam  Emily StarrHeather R Logan is an 23 y.o. female who [facility], IVC'd by mother, and transported by police. [facility] same concerns. She denies SI, HI or AVH although admits to having substance abuse issues. She endorses that she has been using heroin and crack cocaine for the past 2 years. She reports her mother IVC'd her as she is wanting her to get help with her heroin addiction and she endorses the desire to get clean. She was provided with resources last night and reports she was planning on going to receive detox at [facility] today until she was IVC'd. She denies use of ETOH or other substances. She admits to having substance abuse treatment in the past at [facility] in June 2019, [facility] [place] [place] in [place]2018 and 02/2018.  Collateral information was collected from patient mother Emily Logan. [phone]. As per mother, patients behaviors a a direct cause of her substance abuse. She reports that patient is a danger to herself because she abuses meth, crack cocaine, and heroin and that she has been abusing these substances for the past few years.Reports they have tried on multiple occasions to get her into rehab and she has received some substance abuse treatment in the past. She reports that patient becomes very paranoid after she uses the substances and sends text messages to her sister stating that she feels as though people are in the house or trying to get him.  Reports she too becomes very irritable and has lost a lot of weight.  Reports her biggest concern is patients overall health and her substance abuse.  Total Time spent with patient: 15 minutes  Psychiatric Specialty Exam: Physical Exam  Nursing note and vitals reviewed. Constitutional: She is oriented to person, place, and time.  Neurological: She is alert and oriented to person, place, and time.    Review of Systems   Psychiatric/Behavioral: Positive for substance abuse. Negative for depression, hallucinations, memory loss and suicidal ideas. The patient is not nervous/anxious and does not have insomnia.   All other systems reviewed and are negative.   Blood pressure 104/64, pulse (!) 115, temperature 98.4 F (36.9 C), resp. rate 16, SpO2 97 %.There is no height or weight on file to calculate BMI.  General Appearance: Fairly Groomed  Eye Contact:  Good  Speech:  Clear and Coherent and Normal Rate  Volume:  Normal  Mood:  Anxious  Affect:  Congruent  Thought Process:  Coherent, Goal Directed and Descriptions of Associations: Intact  Orientation:  Full (Time, Place, and Person)  Thought Content:  WDL  Suicidal Thoughts:  No  Homicidal Thoughts:  No  Memory:  Immediate;   Fair Recent;   Fair  Judgement:  Fair  Insight:  Fair  Psychomotor Activity:  Normal  Concentration: Concentration: Fair and Attention Span: Fair  Recall:  Fis[employer]Fair  Fund of Knowledge:Fair  Language: Good  Akathisia:  Negative  Handed:  Right  AIMS (if indicated):     Assets:  Communication Skills Desire for Improvement Resilience Social Support  Sleep:       Musculoskeletal: Strength & Muscle Tone: within normal limits Gait & Station: normal Patient leans: N/A  Blood pressure 104/64, pulse (!) 115, temperature 98.4 F (36.9 C), resp. rate 16, SpO2 97 %.  Recommendations:  Based on my evaluation the patient does not appear to have an emergency medical condition.   Disposition: No evidence  of imminent risk to self or others at present.   Patient does not meet criteria for psychiatric inpatient admission. Supportive therapy provided about ongoing stressors. Discussed crisis plan, support from social network, calling 911, coming to the Emergency Department, and calling Suicide Hotline.  Reccommended to follow-up with reosurces provided for substance abuse treatment.   Mordecai Maes, NP 01/08/2019, 3:54 PM

## 2019-01-08 NOTE — BH Assessment (Signed)
Assessment Note  Juleen StarrHeather R Hunt is an 23 y.o. female who [facility] for assessment for opioid detox, but was not admitted due to not meeting admission criteria.  Rer Berna SpareMarcus Harvey's Note dated 01/07/2019: Juleen StarrHeather R Hunt is an 23 y.o. female.  -[facility] in a private vehicle.  Her friend did not participate in the assessment process.  [facility] to be detoxed from heroin.  She said that she uses heroin and crack cocaine.  Patient said that she uses a varying amount of heroin IV daily for about the last year.  Her last use was around 19:30 tonight.  She said she uses crack about every other day and last use was today.  Patient denies any SI, HI or A/V hallucinations.  She is depressed and has guilty feelings about her drug abuse.  She denies use of ETOH.    Patient is restless during interview, tapping feet and playing w/ her hair.  She answers questions without much elaboration.  Patient displays some anxiety.  Her thought content and mood are congruent.  Patient says she was at [facility] in June 2019 for  SA issues.  She was admitted to [facility] ([facility] in 04/2018 and 02/2018.  She was getting outpatient services for a year at [facility] and last time there was 6 months ago.  -Clinician discussed patient care with Nira ConnJason Berry, FNP who completed the MSE.  He did not recommend inpatient care for patient.  Clinician did provide outpatient resources for patient.  Patient said she was going to follow up with [facility] and [facility] tomorrow.(08/04).  Patient left with the friend that brought her.  TTS 01/08/2019:  [facility] petitioned by her mother. IVC paperwork states that patient is a danger to herself and others, she uses heroin and crack cocaine, she thinks that her boyfriend who overdosed and died in two years is in the room telling her what to do.  She has attempted to seek voluntary treatment, but has  not stayed.  She makes statements that she does not want to be here, she has called police because she thinks that there are people breaking into her house, she gets angry when people try to help her and she has been seen things moving in the room that are not there.  Patient is presenting as oriented and alert today.  She is pleasant and cooperative.  Patient states that she wants help for her addiction problem and states that she was packing her things today to go to [facility] for detox when she was picked up by the police. Patient denies SI/HI/Psychosis and states that anything that she might have said or behavior that seems strange occurred while she was intoxicated on drugs.  Patient states, "I don't want to die, that is why I came here last night voluntarily with my bags packed." Patient denies any previous suicide attempts and states that she has never been hospitalized for any reason other than for substance use issues.  Patient states that she has been using a gram of heroin daily since the age of 23.  Her last use was today and she states that she uses cocaine, $20 worth twice weekly.  She states that her boyfriend did die two years ago from an overdose and when he first died, she felt like she could feel his soul present in the room with her, but she  never actually thought that he was there.  Patient is able to contract for safety to seek detox services voluntarily.  She does not appear to be responding to internal stimuli.  Her judgment, insight and impulse control are impaired due to her drug use.  Her eye contact was good, her speech coherent and thoughts organized.  Her memory appeared to be intact.  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  No past surgical history on file.  Family History: No family history on file.  Social History:  reports that she has been smoking. She has  never used smokeless tobacco. She reports current drug use. Drugs: IV and Cocaine. She reports that she does not drink alcohol.  Additional Social History:  Alcohol / Drug Use Pain Medications: see MAR Prescriptions: see MAR Over the Counter: see MAR History of alcohol / drug use?: Yes Longest period of sobriety (when/how long): none reported Negative Consequences of Use: Financial, Personal relationships Substance #1 Name of Substance 1: heroin 1 - Age of First Use: 18 1 - Amount (size/oz): 1 gram 1 - Frequency: daily 1 - Duration: since onset 1 - Last Use / Amount: today Substance #2 Name of Substance 2: cocaine 2 - Age of First Use: 22 2 - Amount (size/oz): $20-40 2 - Frequency: couple times weekly 2 - Duration: since onset 2 - Last Use / Amount: $20 two days ago  CIWA: CIWA-Ar BP: 104/64 Pulse Rate: (!) 115 COWS:    Allergies: No Known Allergies  Home Medications: (Not in a hospital admission)   OB/GYN Status:  No LMP recorded. (Menstrual status: Irregular Periods).  General Assessment Data [facility] TTS Assessment: In system Is this a Tele or Face-to-Face Assessment?: Face-to-Face Is this an Initial Assessment or a Re-assessment for this encounter?: Initial Assessment Patient Accompanied by:: N/A Language Other than English: No Living Arrangements: Other (Comment)(lives with mother) What gender do you identify as?: Female Marital status: Single Living Arrangements: Parent Can pt return to current living arrangement?: Yes Admission Status: Involuntary Petitioner: Family member Is patient capable of signing voluntary admission?: No Referral Source: Self/Family/Friend Insurance type: self-pay  Medical Screening Exam [facility] Walk-in ONLY) Medical Exam completed: Yes  Crisis Care Plan Living Arrangements: Parent Legal Guardian: Other:(self) Name of Psychiatrist: (none) Name of Therapist: none  Education Status Is  patient currently in school?: No Is the patient employed, unemployed or receiving disability?: Unemployed  Risk to self with the past 6 months Suicidal Ideation: No Has patient been a risk to self within the past 6 months prior to admission? : No Suicidal Intent: No Has patient had any suicidal intent within the past 6 months prior to admission? : No Is patient at risk for suicide?: No Suicidal Plan?: No Has patient had any suicidal plan within the past 6 months prior to admission? : No Access to Means: No What has been your use of drugs/alcohol within the last 12 months?: heroin and cocaine Previous Attempts/Gestures: No How many times?: 0 Other Self Harm Risks: SA Issues Triggers for Past Attempts: None known Intentional Self Injurious Behavior: None Family Suicide History: No Recent stressful life event(s): Loss (Comment), Turmoil (Comment)(boyfriend overdosed two years ago) Persecutory voices/beliefs?: Yes Depression: Yes Depression Symptoms: Despondent, Insomnia, Isolating, Fatigue, Loss of interest in usual pleasures Substance abuse history and/or treatment for substance abuse?: Yes Suicide prevention information given to non-admitted patients: Not applicable  Risk  to Others within the past 6 months Homicidal Ideation: No Does patient have any lifetime risk of violence toward others beyond the six months prior to admission? : No Thoughts of Harm to Others: No Current Homicidal Intent: No Current Homicidal Plan: No Access to Homicidal Means: No Identified Victim: no one History of harm to others?: Yes Assessment of Violence: In distant past Violent Behavior Description: no fights since high school Does patient have access to weapons?: No Criminal Charges Pending?: Yes Describe Pending Criminal Charges: (obtaining property by false pretense) Does patient have a court date: Yes Court Date: 01/10/19 Is patient on probation?: No  Psychosis Hallucinations: None  noted Delusions: None noted  Mental Status Report Appearance/Hygiene: Unremarkable Eye Contact: Fair Motor Activity: Freedom of movement Speech: Logical/coherent Level of Consciousness: Alert, Restless Mood: Depressed, Anxious Affect: Anxious, Apprehensive Anxiety Level: Moderate Thought Processes: Coherent, Relevant Judgement: Impaired Orientation: Person, Place, Time, Situation Obsessive Compulsive Thoughts/Behaviors: None  Cognitive Functioning Concentration: Decreased Memory: Recent Intact, Remote Intact Is patient IDD: No Insight: Fair Impulse Control: Poor Appetite: Fair Have you had any weight changes? : Loss Amount of the weight change? (lbs): 30 lbs Sleep: No Change Total Hours of Sleep: 8 Vegetative Symptoms: Staying in bed  ADLScreening [facility] Assessment Services) Patient's cognitive ability adequate to safely complete daily activities?: Yes Patient able to express need for assistance with ADLs?: Yes Independently performs ADLs?: Yes (appropriate for developmental age)  Prior Inpatient Therapy Prior Inpatient Therapy: Yes Prior Therapy Dates: 11/2017, 02/2019 Prior Therapy Facilty/Provider(s): [facility] Reason for Treatment: SA  Prior Outpatient Therapy Prior Outpatient Therapy: Yes Prior Therapy Dates: 2019 Prior Therapy Facilty/Provider(s): [facility] Reason for Treatment: out Does patient have an ACCT team?: No Does patient have Intensive In-House Services?  : No Does patient have [facility] services? : No Does patient have P4CC services?: No  ADL Screening (condition at time of admission) Patient's cognitive ability adequate to safely complete daily activities?: Yes Is the patient deaf or have difficulty hearing?: No Does the patient have difficulty seeing, even when wearing glasses/contacts?: No Does the patient have difficulty concentrating, remembering, or making decisions?: No Patient able to express need for assistance with ADLs?: Yes Does the patient  have difficulty dressing or bathing?: No Independently performs ADLs?: Yes (appropriate for developmental age) Does the patient have difficulty walking or climbing stairs?: No Weakness of Legs: None Weakness of Arms/Hands: None  Home Assistive Devices/Equipment Home Assistive Devices/Equipment: None  Therapy Consults (therapy consults require a physician order) PT Evaluation Needed: No OT Evalulation Needed: No SLP Evaluation Needed: No Abuse/Neglect Assessment (Assessment to be complete while patient is alone) Abuse/Neglect Assessment Can Be Completed: Yes Physical Abuse: Denies Verbal Abuse: Denies Sexual Abuse: Denies Exploitation of patient/patient's resources: Denies Self-Neglect: Denies Values / Beliefs Cultural Requests During Hospitalization: None Spiritual Requests During Hospitalization: None Consults Spiritual Care Consult Needed: No Social Work Consult Needed: No Regulatory affairs officer (For Healthcare) Does Patient Have a Medical Advance Directive?: No Would patient like information on creating a medical advance directive?: No - Patient declined Nutrition Screen- [facility] Adult/WL/AP Has the patient recently lost weight without trying?: No Has the patient been eating poorly because of a decreased appetite?: No Malnutrition Screening Tool Score: 0        Disposition: Per Mordecai Maes, NP, patient does not meet inpatient criteria, but could benefit from detoxification services.  Patient was given referral information Disposition Initial Assessment Completed for this Encounter: Yes Disposition of Patient: Discharge Patient refused recommended treatment: No Type of  treatment offered and refused: Other (Comment) Other disposition(s): Referred to outside facility Mode of transportation if patient is discharged/movement?: Car Patient referred to: [facility]  On Site Evaluation by:   Reviewed with Physician:    Arnoldo Lenisanny J Brendyn Mclaren 01/08/2019 4:26 PM

## 2019-11-06 ENCOUNTER — Ambulatory Visit (HOSPITAL_COMMUNITY)
Admission: EM | Admit: 2019-11-06 | Discharge: 2019-11-06 | Discharge status: Against Medical Advice | Payer: Medicaid Other

## 2019-11-06 ENCOUNTER — Other Ambulatory Visit: Payer: Self-pay

## 2019-11-06 NOTE — ED Notes (Signed)
Patient called again for room, no answer.  All areas of lobby, restroom, and outside entrance checked.  Unable to locate patient.

## 2019-11-06 NOTE — ED Notes (Signed)
Patient called x 2 in lobby with no response. 

## 2019-11-08 ENCOUNTER — Other Ambulatory Visit: Payer: Self-pay

## 2019-11-08 ENCOUNTER — Ambulatory Visit (HOSPITAL_COMMUNITY)
Admission: EM | Admit: 2019-11-08 | Discharge: 2019-11-08 | Disposition: A | Discharge status: Home / Self Care | Payer: Medicaid Other | Attending: Urgent Care | Admitting: Urgent Care

## 2019-11-08 ENCOUNTER — Encounter (HOSPITAL_COMMUNITY): Payer: Self-pay

## 2019-11-08 DIAGNOSIS — M79632 Pain in left forearm: Secondary | ICD-10-CM

## 2019-11-08 DIAGNOSIS — L02414 Cutaneous abscess of left upper limb: Secondary | ICD-10-CM

## 2019-11-08 DIAGNOSIS — F1990 Other psychoactive substance use, unspecified, uncomplicated: Secondary | ICD-10-CM

## 2019-11-08 MED ORDER — DOXYCYCLINE HYCLATE 100 MG PO CAPS: 100.0000 mg | ORAL_CAPSULE | Freq: Two times a day (BID) | ORAL | 0 refills | Status: DC

## 2019-11-08 MED ORDER — NAPROXEN 500 MG PO TABS: 500.0000 mg | ORAL_TABLET | Freq: Two times a day (BID) | ORAL | 0 refills | Status: DC

## 2019-11-08 NOTE — ED Triage Notes (Signed)
Pt presents with abscess that is draining on left forearm that is draining X 2 weeks.

## 2019-11-08 NOTE — Discharge Instructions (Addendum)
Please change your dressing 2-3 times daily. Do not apply any ointments or creams. Each time you change your dressing, make sure you clean gently around the perimeter of the wound with gentle soap and warm water. Pat your wound dry and let it air out if possible for 1-2 hours before reapplying another dressing.   If you develop worse symptoms, fever, redness, more swelling, red streaks along your arm then please report to the ER immediately.

## 2019-11-08 NOTE — ED Provider Notes (Signed)
[place]   MRN: [id number] DOB: 03-05-1996  Subjective:   Emily Logan is a 24 y.o. female presenting for 2-week history of persistent abscess of her left forearm pain, drainage of pus.  Has past medical history of IV drug use, severe abscess requiring surgical drainage and hospitalization.  Patient has since lanced many other boils and abscesses successfully except for the one today.  Denies fever, nausea, vomiting, chest pain, shortness of breath, diaphoresis.  She plans on checking into a treatment center for her drug use.  Denies taking chronic medications.   No Known Allergies  PMH of IV drug use, severe abscess.   PSH of surgical I&D of her left forearm.   History reviewed. No pertinent family history.  Social History   Tobacco Use   Smoking status: Current Every Day Smoker   Smokeless tobacco: Never Used  Substance Use Topics   Alcohol use: No   Drug use: Yes    Types: IV, Cocaine    Comment: heroin    ROS   Objective:   Vitals: BP 104/64 (BP Location: Right Arm)    Pulse 98    Temp 98.7 F (37.1 C) (Oral)    Resp 18    SpO2 97%   Physical Exam Constitutional:      General: She is not in acute distress.    Appearance: Normal appearance. She is well-developed. She is not ill-appearing, toxic-appearing or diaphoretic.  HENT:     Head: Normocephalic and atraumatic.     Nose: Nose normal.     Mouth/Throat:     Mouth: Mucous membranes are moist.     Pharynx: Oropharynx is clear.  Eyes:     General: No scleral icterus.       Right eye: No discharge.        Left eye: No discharge.     Extraocular Movements: Extraocular movements intact.     Pupils: Pupils are equal, round, and reactive to light.  Cardiovascular:     Rate and Rhythm: Normal rate and regular rhythm.     Pulses: Normal pulses.     Heart sounds: Normal heart sounds. No murmur. No friction rub. No gallop.   Pulmonary:     Effort: Pulmonary effort is normal. No respiratory  distress.     Breath sounds: Normal breath sounds. No stridor. No wheezing, rhonchi or rales.  Musculoskeletal:     Left forearm: Swelling and tenderness (minimal) present. No edema, deformity, lacerations or bony tenderness.       Arms:     Comments: No red streaks along the arm.  Full range of motion.  Skin:    General: Skin is warm and dry.     Findings: No rash.  Neurological:     General: No focal deficit present.     Mental Status: She is alert and oriented to person, place, and time.  Psychiatric:        Mood and Affect: Mood normal.        Behavior: Behavior normal.        Thought Content: Thought content normal.        Judgment: Judgment normal.      Assessment and Plan :   PDMP not reviewed this encounter.  1. Abscess of left forearm   2. Pain of left forearm   3. IV drug user     Patient is to start doxycycline to address resolving abscess.  Counseled that she is a very high risk for  severe infection given her history of IV drug use.  However due to reassuring physical exam findings, stable vital signs will manage outpatient with strict ER precautions.  Wound care reviewed. Counseled patient on potential for adverse effects with medications prescribed/recommended today, ER and return-to-clinic precautions discussed, patient verbalized understanding.    Wallis Bamberg, PA-C 11/08/19 1406

## 2020-07-14 ENCOUNTER — Ambulatory Visit (HOSPITAL_COMMUNITY)
Admission: EM | Admit: 2020-07-14 | Discharge: 2020-07-15 | Disposition: A | Discharge status: Other Acute Inpatient Hospital | Payer: No Payment, Other | Attending: Psychiatry | Admitting: Psychiatry

## 2020-07-14 ENCOUNTER — Other Ambulatory Visit: Payer: Self-pay

## 2020-07-14 ENCOUNTER — Encounter (HOSPITAL_COMMUNITY): Payer: Self-pay

## 2020-07-14 ENCOUNTER — Ambulatory Visit (HOSPITAL_COMMUNITY)
Admission: RE | Admit: 2020-07-14 | Discharge: 2020-07-14 | Disposition: A | Discharge status: Home / Self Care | Payer: Self-pay | Attending: Psychiatry | Admitting: Psychiatry

## 2020-07-14 DIAGNOSIS — Z8659 Personal history of other mental and behavioral disorders: Secondary | ICD-10-CM | POA: Insufficient documentation

## 2020-07-14 DIAGNOSIS — F1994 Other psychoactive substance use, unspecified with psychoactive substance-induced mood disorder: Secondary | ICD-10-CM | POA: Insufficient documentation

## 2020-07-14 DIAGNOSIS — Z818 Family history of other mental and behavioral disorders: Secondary | ICD-10-CM | POA: Insufficient documentation

## 2020-07-14 DIAGNOSIS — F1490 Cocaine use, unspecified, uncomplicated: Secondary | ICD-10-CM | POA: Insufficient documentation

## 2020-07-14 DIAGNOSIS — F17210 Nicotine dependence, cigarettes, uncomplicated: Secondary | ICD-10-CM | POA: Insufficient documentation

## 2020-07-14 DIAGNOSIS — F1120 Opioid dependence, uncomplicated: Secondary | ICD-10-CM | POA: Insufficient documentation

## 2020-07-14 DIAGNOSIS — F1510 Other stimulant abuse, uncomplicated: Secondary | ICD-10-CM

## 2020-07-14 DIAGNOSIS — Z20822 Contact with and (suspected) exposure to covid-19: Secondary | ICD-10-CM | POA: Insufficient documentation

## 2020-07-14 DIAGNOSIS — F1590 Other stimulant use, unspecified, uncomplicated: Secondary | ICD-10-CM | POA: Insufficient documentation

## 2020-07-14 DIAGNOSIS — R45851 Suicidal ideations: Secondary | ICD-10-CM | POA: Insufficient documentation

## 2020-07-14 LAB — CBC WITH DIFFERENTIAL/PLATELET
Abs Immature Granulocytes: 0.02 10*3/uL (ref 0.00–0.07)
Basophils Absolute: 0.1 10*3/uL (ref 0.0–0.1)
Basophils Relative: 1 %
Eosinophils Absolute: 0.6 10*3/uL — ABNORMAL HIGH (ref 0.0–0.5)
Eosinophils Relative: 8 %
HCT: 38.8 % (ref 36.0–46.0)
Hemoglobin: 11.7 g/dL — ABNORMAL LOW (ref 12.0–15.0)
Immature Granulocytes: 0 %
Lymphocytes Relative: 30 %
Lymphs Abs: 2.3 10*3/uL (ref 0.7–4.0)
MCH: 24.9 pg — ABNORMAL LOW (ref 26.0–34.0)
MCHC: 30.2 g/dL (ref 30.0–36.0)
MCV: 82.7 fL (ref 80.0–100.0)
Monocytes Absolute: 0.8 10*3/uL (ref 0.1–1.0)
Monocytes Relative: 10 %
Neutro Abs: 4 10*3/uL (ref 1.7–7.7)
Neutrophils Relative %: 51 %
Platelets: 238 10*3/uL (ref 150–400)
RBC: 4.69 MIL/uL (ref 3.87–5.11)
RDW: 14.4 % (ref 11.5–15.5)
WBC: 7.9 10*3/uL (ref 4.0–10.5)
nRBC: 0 % (ref 0.0–0.2)

## 2020-07-14 LAB — POCT URINE DRUG SCREEN - MANUAL ENTRY (I-SCREEN)
POC Amphetamine UR: POSITIVE — AB
POC Buprenorphine (BUP): NOT DETECTED
POC Cocaine UR: POSITIVE — AB
POC Marijuana UR: NOT DETECTED
POC Methadone UR: NOT DETECTED
POC Methamphetamine UR: POSITIVE — AB
POC Morphine: POSITIVE — AB
POC Oxazepam (BZO): NOT DETECTED
POC Oxycodone UR: NOT DETECTED
POC Secobarbital (BAR): NOT DETECTED

## 2020-07-14 LAB — POCT PREGNANCY, URINE: Preg Test, Ur: NEGATIVE

## 2020-07-14 LAB — POC SARS CORONAVIRUS 2 AG -  ED: SARS Coronavirus 2 Ag: NEGATIVE

## 2020-07-14 MED ORDER — ACETAMINOPHEN 325 MG PO TABS: 650.0000 mg | ORAL_TABLET | Freq: Four times a day (QID) | ORAL | Status: DC | PRN

## 2020-07-14 MED ORDER — CLONIDINE HCL 0.1 MG PO TABS
0.1000 mg | ORAL_TABLET | Freq: Four times a day (QID) | ORAL | Status: DC
  Administered 2020-07-14 – 2020-07-15 (×3): 0.1 mg via ORAL
  Filled 2020-07-14 (×3): qty 1

## 2020-07-14 MED ORDER — NAPROXEN 500 MG PO TABS: 500.0000 mg | ORAL_TABLET | Freq: Two times a day (BID) | ORAL | Status: DC | PRN

## 2020-07-14 MED ORDER — CLONIDINE HCL 0.1 MG PO TABS: 0.1000 mg | ORAL_TABLET | Freq: Every day | ORAL | Status: DC

## 2020-07-14 MED ORDER — MAGNESIUM HYDROXIDE 400 MG/5ML PO SUSP: 30.0000 mL | Freq: Every day | ORAL | Status: DC | PRN

## 2020-07-14 MED ORDER — ALUM & MAG HYDROXIDE-SIMETH 200-200-20 MG/5ML PO SUSP: 30.0000 mL | ORAL | Status: DC | PRN

## 2020-07-14 MED ORDER — CLONIDINE HCL 0.1 MG PO TABS: 0.1000 mg | ORAL_TABLET | ORAL | Status: DC

## 2020-07-14 MED ORDER — ONDANSETRON 4 MG PO TBDP: 4.0000 mg | ORAL_TABLET | Freq: Four times a day (QID) | ORAL | Status: DC | PRN

## 2020-07-14 MED ORDER — METHOCARBAMOL 500 MG PO TABS: 500.0000 mg | ORAL_TABLET | Freq: Three times a day (TID) | ORAL | Status: DC | PRN

## 2020-07-14 MED ORDER — LOPERAMIDE HCL 2 MG PO CAPS: 2.0000 mg | ORAL_CAPSULE | ORAL | Status: DC | PRN

## 2020-07-14 MED ORDER — DICYCLOMINE HCL 20 MG PO TABS: 20.0000 mg | ORAL_TABLET | Freq: Four times a day (QID) | ORAL | Status: DC | PRN

## 2020-07-14 MED ORDER — TRAZODONE HCL 50 MG PO TABS
50.0000 mg | ORAL_TABLET | Freq: Every evening | ORAL | Status: DC | PRN
  Filled 2020-07-14: qty 1

## 2020-07-14 MED ORDER — HYDROXYZINE HCL 25 MG PO TABS: 25.0000 mg | ORAL_TABLET | Freq: Three times a day (TID) | ORAL | Status: DC | PRN

## 2020-07-14 NOTE — H&P (Signed)
Behavioral Health Medical Screening Exam  Emily Logan is an 25 y.o. female with history of depression and heroin use disorder. She is requesting help with depression, suicidal ideation, and heroin use. She reports daily heroin use of up to 0.5 g/day for 7 years, with last use at noon today. She also admits to occasional crack and meth use, last used last night. She did IOP at [facility] in the past and was in their methadone program. She states she was discharged from the methadone program in December 2021 due to inconsistent adherence and continuing to use substances. They had referred her for detox, but she never followed up. She reports increasing depression over the past week with suicidal thoughts. Denies HI/AVH. She reports ongoing SI, denies plan or intent but unable to contract for safety. Her husband is reporting safety concerns for discharge home with her level of emotional instability over the past 24 hours. Patient's stepson also struggled with substance use and killed himself in the last year.  Total Time spent with patient: 30 minutes  Psychiatric Specialty Exam: Physical Exam Vitals reviewed.  Constitutional:      Appearance: She is well-developed and well-nourished.  Cardiovascular:     Rate and Rhythm: Normal rate.  Pulmonary:     Effort: Pulmonary effort is normal.  Neurological:     Mental Status: She is alert and oriented to person, place, and time.    Review of Systems  Constitutional: Negative.   Respiratory: Negative for cough and shortness of breath.   Psychiatric/Behavioral: Positive for dysphoric mood and suicidal ideas. Negative for agitation, behavioral problems, confusion, decreased concentration, hallucinations and self-injury. The patient is nervous/anxious. The patient is not hyperactive.    Blood pressure 111/66, pulse 84, temperature 98 F (36.7 C), temperature source Oral, resp. rate 18, SpO2 100 %.There is no height or weight on file to calculate  BMI. General Appearance: Casual Eye Contact:  Good Speech:  Normal Rate Volume:  Normal Mood:  Depressed Affect:  Congruent Thought Process:  Coherent Orientation:  Full (Time, Place, and Person) Thought Content:  Logical Suicidal Thoughts:  Yes.  without intent/plan Homicidal Thoughts:  No Memory:  Immediate;   Fair Recent;   Fair Remote;   Fair Judgement:  Fair Insight:  Fair Psychomotor Activity:  Normal Concentration: Concentration: Fair and Attention Span: Fair Recall:  [employer] of Knowledge:Fair Language: Fair Akathisia:  No Handed:  Right AIMS (if indicated):    Assets:  Communication Skills Desire for Improvement Housing Leisure Time Physical Health Social Support Sleep:     Musculoskeletal: Strength & Muscle Tone: within normal limits Gait & Station: normal Patient leans: N/A  Blood pressure 111/66, pulse 84, temperature 98 F (36.7 C), temperature source Oral, resp. rate 18, SpO2 100 %.  Recommendations: Based on my evaluation the patient does not appear to have an emergency medical condition.  Overnight observation- transferring to [facility]. [facility] provider and nursing staff notified.  Aldean Baker, NP 07/14/2020, 7:14 PM

## 2020-07-14 NOTE — BH Assessment (Signed)
Comprehensive Clinical Assessment (CCA) Note  07/14/2020 Emily Logan [id number]   Emily Logan is a 25 year old female presenting voluntarily as walk-in to [facility] due to [facility] and opioid dependence. Patient was very lethargic and intoxicated. Patient is accompanied by her husband. Patient reports daily heroin use of up to 0.5 g/day for 7 years, with last use at noon today. Patient admits to occasional crack and meth use, last used last night. Patient was in IOP at [facility] in the past and was in their methadone program. Patient reported being discharged from the methadone program in December 2021 due to inconsistent adherence and continuing to use substances. Patient becomes agitated easily in the home and is up all night. Patient reported worsening depressive symptoms. Patient lacks self-care. Husband described patients current mental health status as on a downward spiral. Husband reported his son, her stepson was on drugs last year and committed suicide by stepping in front of traffic. Patients husband is reporting safety concerns for discharge home with her level of emotional instability over the past 24 hours. Patient gave permission for Luther Hearing, husband to be present during assessment.   Disposition Marciano Sequin, NP, recommends continual observation for safety and stabilization with psych reassessment in the AM.  Chief Complaint:  Chief Complaint  Patient presents with   Psychiatric Evaluation   Visit Diagnosis:  Substance induced mood disorder (HCC)  Opioid use disorder, severe, dependence (HCC)  Crack cocaine use  Methamphetamine use (HCC   CCA Screening, Triage and Referral (STR)  Patient Reported Information How did you hear about [place]? No data recorded Referral name: No data recorded Referral phone number: No data recorded  Whom do you see for routine medical problems? No data recorded Practice/Facility Name: No data recorded Practice/Facility Phone Number: No data  recorded Name of Contact: No data recorded Contact Number: No data recorded Contact Fax Number: No data recorded Prescriber Name: No data recorded Prescriber Address (if known): No data recorded  What Is the Reason for Your Visit/Call Today? Inpatient treatment How Long Has This Been Causing You Problems? No data recorded What Do You Feel Would Help You the Most Today? No data recorded  Have You Recently Been in Any Inpatient Treatment (Hospital/Detox/Crisis Center/28-Day Program)? Yes Name/Location of Program/Hospital: [facility] How Long Were You There? No data recorded When Were You Discharged? No data recorded  Have You Ever Received Services From [facility] Before? No data recorded Who Do You See at [facility]? No data recorded  Have You Recently Had Any Thoughts About Hurting Yourself? No data recorded Are You Planning to Commit Suicide/Harm Yourself At This time? No data recorded  Have you Recently Had Thoughts About Hurting Someone Karolee Ohs? No data recorded Explanation: No data recorded  Have You Used Any Alcohol or Drugs in the Past 24 Hours? No data recorded How Long Ago Did You Use Drugs or Alcohol? No data recorded What Did You Use and How Much? No data recorded  Do You Currently Have a Therapist/Psychiatrist? No data recorded Name of Therapist/Psychiatrist: No data recorded  Have You Been Recently Discharged From Any Office Practice or Programs? No data recorded Explanation of Discharge From Practice/Program: No data recorded  CCA Biopsychosocial Intake/Chief Complaint:  SI with no plan and substance abuse.  Current Symptoms/Problems: Worsening depressive symptoms and substance abuse.  Patient Reported Schizophrenia/Schizoaffective Diagnosis in Past: No  Strengths: No data recorded Preferences: self-awareness  Abilities: No data recorded  Type of Services Patient Feels are  Needed: inpatient treatment  Initial Clinical Notes/Concerns: No data  recorded  Mental Health Symptoms Depression:  Worthlessness; Hopelessness; Increase/decrease in appetite; Fatigue; Tearfulness   Duration of Depressive symptoms: Greater than two weeks   Mania:  None   Anxiety:   Increased  Psychosis:  None   Duration of Psychotic symptoms: No data recorded  Trauma:  None   Obsessions:  None   Compulsions:  None   Inattention:  None   Hyperactivity/Impulsivity:  N/A   Oppositional/Defiant Behaviors:  N/A   Emotional Irregularity:  No data recorded  Other Mood/Personality Symptoms:  No data recorded   Mental Status Exam Appearance and self-care  Stature:  Average   Weight:  Average weight   Clothing:  No data recorded  Grooming:  Normal   Cosmetic use:  Age appropriate   Posture/gait:  Normal   Motor activity:  Slowed   Sensorium  Attention:  Normal   Concentration:  Normal   Orientation:  X5   Recall/memory:  Normal   Affect and Mood  Affect:  Labile; Depressed   Mood:  Depressed; Hopeless; Worthless   Relating  Eye contact:  Normal   Facial expression:  Depressed; Sad   Attitude toward examiner:  Cooperative   Thought and Language  Speech flow: Slow   Thought content:  Appropriate to Mood and Circumstances   Preoccupation:  None   Hallucinations:  None   Organization:  No data recorded  [employer] of Knowledge:  Poor   Intelligence:  Average   Abstraction:  Normal   Judgement:  Impaired   Reality Testing:  No data recorded  Insight:  Lacking   Decision Making:  Confused   Social Functioning  Social Maturity:  No data recorded  Social Judgement:  Normal   Stress  Stressors:  Other (Comment); Grief/losses (substance abuse)   Coping Ability:  Exhausted; Overwhelmed   Skill Deficits:  Self-care; Self-control; Responsibility   Supports:  Family    Religion:   Leisure/Recreation: Leisure / Recreation Do You Have Hobbies?: No  Exercise/Diet: Exercise/Diet Do You  Exercise?: No Do You Follow a Special Diet?: No Do You Have Any Trouble Sleeping?: Yes Explanation of Sleeping Difficulties: "up all night"  CCA Employment/Education Employment/Work Situation: Employment / Work Situation Employment situation: Unemployed Patient's job has been impacted by current illness: Yes Describe how patient's job has been impacted: unemployed due to drug usage Has patient ever been in the [employer]?: No  Education: Education Is Patient Currently Attending School?: No Name of High School: [employer] Did You Have An Individualized Education Program (IIEP): No Did You Have Any Difficulty At [employer]?: No Patient's Education Has Been Impacted by Current Illness: No  CCA Family/Childhood History Family and Relationship History: Family history Marital status: Married Number of Years Married:  Industrial/product designer) What types of issues is patient dealing with in the relationship?: patient drug addiction Does patient have children?: No  Childhood History:  Childhood History Description of patient's relationship with caregiver when they were a child: uta Patient's description of current relationship with people who raised him/her: uta How were you disciplined when you got in trouble as a child/adolescent?: uta Does patient have siblings?:  (uta) Did patient suffer any verbal/emotional/physical/sexual abuse as a child?: No Did patient suffer from severe childhood neglect?: No Has patient ever been sexually abused/assaulted/raped as an adolescent or adult?: No Was the patient ever a victim of a crime or a disaster?: No Witnessed domestic violence?: No Has patient been affected by  domestic violence as an adult?: No  Child/Adolescent Assessment:   CCA Substance Use Alcohol/Drug Use: Alcohol / Drug Use Pain Medications: see MAR Prescriptions: see MAR Over the Counter: see MAR History of alcohol / drug use?: Yes Substance #1 Name of Substance 1: heroin 1 - Amount (size/oz): .5  grams 1 - Frequency: daily 1 - Last Use / Amount: 12noon Substance #2 Name of Substance 2: crack/cocaine 2 - Amount (size/oz): unknown 2 - Frequency: daily 2 - Last Use / Amount: 12noon   ASAM's:  Six Dimensions of Multidimensional Assessment  Dimension 1:  Acute Intoxication and/or Withdrawal Potential:      Dimension 2:  Biomedical Conditions and Complications:      Dimension 3:  Emotional, Behavioral, or Cognitive Conditions and Complications:     Dimension 4:  Readiness to Change:     Dimension 5:  Relapse, Continued use, or Continued Problem Potential:     Dimension 6:  Recovery/Living Environment:     ASAM Severity Score:    ASAM Recommended Level of Treatment:     Substance use Disorder (SUD)   Recommendations for Services/Supports/Treatments:   DSM5 Diagnoses: There are no problems to display for this patient.  Patient Centered Plan: Patient is on the following Treatment Plan(s):    Referrals to Alternative Service(s): Referred to Alternative Service(s):   Place:   Date:   Time:    Referred to Alternative Service(s):   Place:   Date:   Time:    Referred to Alternative Service(s):   Place:   Date:   Time:    Referred to Alternative Service(s):   Place:   Date:   Time:     Burnetta Sabin, [facility]

## 2020-07-14 NOTE — ED Triage Notes (Signed)
Patient arrives from [facility] for overnight observation with complaints of depression. Patient endorses feeling like 'I'd be better off if I weren't here but not like I would kill myself'. Patient denies HI/AVH. Patient fidgety, calm & cooperative.

## 2020-07-14 NOTE — ED Provider Notes (Addendum)
Behavioral Health Admission H&P [facility] & OBS)  Date: 07/15/20 Patient Name: Emily Logan Logan MRN: [id number] Chief Complaint:  Chief Complaint  Patient presents with  . Depression  . Addiction Problem      Diagnoses:  Final diagnoses:  Substance induced mood disorder (HCC)  Opioid use disorder, severe, dependence (HCC)  Crack cocaine use  Methamphetamine use (HCC)    HPI: Emily Logan is an 25 y.o. female with history of depression and heroin use disorder who presented to [facility] for an assessment. She was transferred to [facility] for continuous assessment. She is requesting help with depression, suicidal ideation, and heroin use. She reports daily heroin use of up to 0.5 g/day for 7 years, with last use at noon today. She also admits to occasional crack and meth use, last used last night. She did IOP at [facility] in the past and was in their methadone program. She states she was discharged from the methadone program in December 2021 due to inconsistent adherence and continuing to use substances. They had referred her for detox, but she never followed up. She reports increasing depression over the past week with suicidal thoughts. Denies HI/AVH. She reports ongoing SI, denies plan or intent but unable to contract for safety. Her husband is reporting safety concerns for discharge home with her level of emotional instability over the past 24 hours. Patient's stepson also struggled with substance use and killed himself in the last year.  PHQ 2-9:   Flowsheet Row ED from 07/14/2020 in [facility]  C-SSRS RISK CATEGORY Low Risk       Total Time spent with patient: 20 minutes  Musculoskeletal  Strength & Muscle Tone: within normal limits Gait & Station: normal Patient leans: N/A  Psychiatric Specialty Exam  Presentation General Appearance: Casual; Fairly Groomed  Eye Contact:Fair  Speech:Slurred  Speech Volume:Decreased  Handedness:Right   Mood and  Affect  Mood:Anxious; Depressed  Affect:Congruent; Depressed   Thought Process  Thought Processes:Coherent  Descriptions of Associations:Intact  Orientation:Full (Time, Place and Person)  Thought Content:Logical  Hallucinations:Hallucinations: None  Ideas of Reference:None  Suicidal Thoughts:Suicidal Thoughts: Yes, Passive SI Passive Intent and/or Plan: Without Intent; Without Plan  Homicidal Thoughts:Homicidal Thoughts: No   Sensorium  Memory:Immediate Fair; Recent Fair; Remote Fair  Judgment:Fair  Insight:Fair   Executive Functions  Concentration:Fair  Attention Span:Fair  Recall:Fair  Fund of Knowledge:Fair  Language:Good   Psychomotor Activity  Psychomotor Activity:Psychomotor Activity: Normal   Assets  Assets:Desire for Improvement; Physical Health   Sleep  Sleep:Sleep: Poor   Physical Exam Constitutional:      General: She is not in acute distress.    Appearance: She is not ill-appearing, toxic-appearing or diaphoretic.  HENT:     Head: Normocephalic.     Right Ear: External ear normal.     Left Ear: External ear normal.  Eyes:     Conjunctiva/sclera: Conjunctivae normal.     Pupils: Pupils are equal, round, and reactive to light.  Cardiovascular:     Rate and Rhythm: Normal rate.  Pulmonary:     Effort: Pulmonary effort is normal. No respiratory distress.  Musculoskeletal:        General: Normal range of motion.  Skin:    General: Skin is warm and dry.  Neurological:     General: No focal deficit present.     Mental Status: She is alert and oriented to person, place, and time.  Psychiatric:        Mood  and Affect: Mood is anxious and depressed.        Thought Content: Thought content is not paranoid or delusional. Thought content includes suicidal ideation. Thought content does not include homicidal ideation. Thought content does not include suicidal plan.    Review of Systems  Constitutional: Negative for chills,  diaphoresis, fever, malaise/fatigue and weight loss.  HENT: Negative for congestion.   Respiratory: Negative for cough and shortness of breath.   Cardiovascular: Negative for chest pain and palpitations.  Gastrointestinal: Negative for diarrhea, nausea and vomiting.  Neurological: Negative for dizziness.  Psychiatric/Behavioral: Positive for depression, substance abuse and suicidal ideas. Negative for hallucinations and memory loss. The patient is nervous/anxious and has insomnia.   All other systems reviewed and are negative.   Blood pressure (!) 108/57, pulse 79, temperature (!) 97.3 F (36.3 C), temperature source Tympanic, resp. rate 18, SpO2 100 %. There is no height or weight on file to calculate BMI.  Past Psychiatric History: MDD, opioid use disorder (heroin)  Is the patient at risk to self? Yes  Has the patient been a risk to self in the past 6 months? No .    Has the patient been a risk to self within the distant past? Yes   Is the patient a risk to others? No   Has the patient been a risk to others in the past 6 months? No   Has the patient been a risk to others within the distant past? No   Past Medical History: History reviewed. No pertinent past medical history. History reviewed. No pertinent surgical history.  Family History: History reviewed. No pertinent family history.  Social History:  Social History   Socioeconomic History  . Marital status: Single    Spouse name: Not on file  . Number of children: Not on file  . Years of education: Not on file  . Highest education level: Not on file  Occupational History  . Not on file  Tobacco Use  . Smoking status: Current Every Day Smoker    Packs/day: 1.00    Types: Cigarettes  . Smokeless tobacco: Never Used  Vaping Use  . Vaping Use: Never used  Substance and Sexual Activity  . Alcohol use: Never  . Drug use: Yes    Types: Heroin, "Crack" cocaine, Methamphetamines    Comment: meth 1st use 2/7, crack $20/day,  heroin 0.5-1G/day  . Sexual activity: Yes    Birth control/protection: None  Other Topics Concern  . Not on file  Social History Narrative  . Not on file   Social Determinants of Health   Financial Resource Strain: Not on file  Food Insecurity: Not on file  Transportation Needs: Not on file  Physical Activity: Not on file  Stress: Not on file  Social Connections: Not on file  Intimate Partner Violence: Not on file    SDOH:  SDOH Screenings   Alcohol Screen: Not on file  Depression (TFT7-3): Not on file  Financial Resource Strain: Not on file  Food Insecurity: Not on file  Housing: Not on file  Physical Activity: Not on file  Social Connections: Not on file  Stress: Not on file  Tobacco Use: High Risk  . Smoking Tobacco Use: Current Every Day Smoker  . Smokeless Tobacco Use: Never Used  Transportation Needs: Not on file    Last Labs:  Admission on 07/14/2020  Component Date Value Ref Range Status  . SARS Coronavirus 2 by RT PCR 07/14/2020 NEGATIVE  NEGATIVE Final  Comment: (NOTE) SARS-CoV-2 target nucleic acids are NOT DETECTED.  The SARS-CoV-2 RNA is generally detectable in upper respiratory specimens during the acute phase of infection. The lowest concentration of SARS-CoV-2 viral copies this assay can detect is 138 copies/mL. A negative result does not preclude SARS-Cov-2 infection and should not be used as the sole basis for treatment or other patient management decisions. A negative result may occur with  improper specimen collection/handling, submission of specimen other than nasopharyngeal swab, presence of viral mutation(s) within the areas targeted by this assay, and inadequate number of viral copies(<138 copies/mL). A negative result must be combined with clinical observations, patient history, and epidemiological information. The expected result is Negative.  Fact Sheet for Patients:  BloggerCourse.comhttps://www.fda.gov/media/152166/download  Fact Sheet for  Healthcare Providers:  SeriousBroker.ithttps://www.fda.gov/media/152162/download  This test is no                          t yet approved or cleared by the [place] FDA and  has been authorized for detection and/or diagnosis of SARS-CoV-2 by FDA under an Emergency Use Authorization (EUA). This EUA will remain  in effect (meaning this test can be used) for the duration of the COVID-19 declaration under Section 564(b)(1) of the Act, 21 U.S.C.section 360bbb-3(b)(1), unless the authorization is terminated  or revoked sooner.      . Influenza A by PCR 07/14/2020 NEGATIVE  NEGATIVE Final  . Influenza B by PCR 07/14/2020 NEGATIVE  NEGATIVE Final   Comment: (NOTE) The Xpert Xpress SARS-CoV-2/FLU/RSV plus assay is intended as an aid in the diagnosis of influenza from Nasopharyngeal swab specimens and should not be used as a sole basis for treatment. Nasal washings and aspirates are unacceptable for Xpert Xpress SARS-CoV-2/FLU/RSV testing.  Fact Sheet for Patients: BloggerCourse.comhttps://www.fda.gov/media/152166/download  Fact Sheet for Healthcare Providers: SeriousBroker.ithttps://www.fda.gov/media/152162/download  This test is not yet approved or cleared by the [place] FDA and has been authorized for detection and/or diagnosis of SARS-CoV-2 by FDA under an Emergency Use Authorization (EUA). This EUA will remain in effect (meaning this test can be used) for the duration of the COVID-19 declaration under Section 564(b)(1) of the Act, 21 U.S.C. section 360bbb-3(b)(1), unless the authorization is terminated or revoked.  Performed at [facility], [address] [place], [place] [id number]   . SARS Coronavirus 2 Ag 07/14/2020 Negative  Negative Preliminary  . WBC 07/14/2020 7.9  4.0 - 10.5 K/uL Final  . RBC 07/14/2020 4.69  3.87 - 5.11 MIL/uL Final  . Hemoglobin 07/14/2020 11.7* 12.0 - 15.0 g/dL Final  . HCT 47/82/956202/01/2021 38.8  36.0 - 46.0 % Final  . MCV 07/14/2020 82.7  80.0 - 100.0 fL Final  . MCH 07/14/2020 24.9*  26.0 - 34.0 pg Final  . MCHC 07/14/2020 30.2  30.0 - 36.0 g/dL Final  . RDW 13/08/657802/01/2021 14.4  11.5 - 15.5 % Final  . Platelets 07/14/2020 238  150 - 400 K/uL Final  . nRBC 07/14/2020 0.0  0.0 - 0.2 % Final  . Neutrophils Relative % 07/14/2020 51  % Final  . Neutro Abs 07/14/2020 4.0  1.7 - 7.7 K/uL Final  . Lymphocytes Relative 07/14/2020 30  % Final  . Lymphs Abs 07/14/2020 2.3  0.7 - 4.0 K/uL Final  . Monocytes Relative 07/14/2020 10  % Final  . Monocytes Absolute 07/14/2020 0.8  0.1 - 1.0 K/uL Final  . Eosinophils Relative 07/14/2020 8  % Final  . Eosinophils Absolute 07/14/2020 0.6* 0.0 -  0.5 K/uL Final  . Basophils Relative 07/14/2020 1  % Final  . Basophils Absolute 07/14/2020 0.1  0.0 - 0.1 K/uL Final  . Immature Granulocytes 07/14/2020 0  % Final  . Abs Immature Granulocytes 07/14/2020 0.02  0.00 - 0.07 K/uL Final   Performed at [facility], [address] [place], [place] [address]  . Sodium 07/14/2020 138  135 - 145 mmol/L Final  . Potassium 07/14/2020 3.9  3.5 - 5.1 mmol/L Final  . Chloride 07/14/2020 102  98 - 111 mmol/L Final  . CO2 07/14/2020 26  22 - 32 mmol/L Final  . Glucose, Bld 07/14/2020 65* 70 - 99 mg/dL Final   Glucose reference range applies only to samples taken after fasting for at least 8 hours.  . BUN 07/14/2020 10  6 - 20 mg/dL Final  . Creatinine, Ser 07/14/2020 0.54  0.44 - 1.00 mg/dL Final  . Calcium 14/43/1540 8.9  8.9 - 10.3 mg/dL Final  . Total Protein 07/14/2020 7.0  6.5 - 8.1 g/dL Final  . Albumin 08/67/6195 3.7  3.5 - 5.0 g/dL Final  . AST 09/32/6712 21  15 - 41 U/L Final  . ALT 07/14/2020 10  0 - 44 U/L Final  . Alkaline Phosphatase 07/14/2020 79  38 - 126 U/L Final  . Total Bilirubin 07/14/2020 0.7  0.3 - 1.2 mg/dL Final  . GFR, Estimated 07/14/2020 >60  >60 mL/min Final   Comment: (NOTE) Calculated using the CKD-EPI Creatinine Equation (2021)   . Anion gap 07/14/2020 10  5 - 15 Final   Performed at [facility], [address], [place], [place] [address]  . Hgb A1c MFr Bld 07/14/2020 5.5  4.8 - 5.6 % Final   Comment: (NOTE) Pre diabetes:          5.7%-6.4%  Diabetes:              >6.4%  Glycemic control for   <7.0% adults with diabetes   . Mean Plasma Glucose 07/14/2020 111.15  mg/dL Final   Performed at [facility], [address], [place], [place] [address]  . TSH 07/14/2020 7.784* 0.350 - 4.500 uIU/mL Final   Comment: Performed by a 3rd Generation assay with a functional sensitivity of <=0.01 uIU/mL. Performed at [facility], [address], [place], [place] [address]   . POC Amphetamine UR 07/14/2020 Positive* NONE DETECTED (Cut Off Level 1000 ng/mL) Final  . POC Secobarbital (BAR) 07/14/2020 None Detected  NONE DETECTED (Cut Off Level 300 ng/mL) Final  . POC Buprenorphine (BUP) 07/14/2020 None Detected  NONE DETECTED (Cut Off Level 10 ng/mL) Final  . POC Oxazepam (BZO) 07/14/2020 None Detected  NONE DETECTED (Cut Off Level 300 ng/mL) Final  . POC Cocaine UR 07/14/2020 Positive* NONE DETECTED (Cut Off Level 300 ng/mL) Final  . POC Methamphetamine UR 07/14/2020 Positive* NONE DETECTED (Cut Off Level 1000 ng/mL) Final  . POC Morphine 07/14/2020 Positive* NONE DETECTED (Cut Off Level 300 ng/mL) Final  . POC Oxycodone UR 07/14/2020 None Detected  NONE DETECTED (Cut Off Level 100 ng/mL) Final  . POC Methadone UR 07/14/2020 None Detected  NONE DETECTED (Cut Off Level 300 ng/mL) Final  . POC Marijuana UR 07/14/2020 None Detected  NONE DETECTED (Cut Off Level 50 ng/mL) Final  . Preg Test, Ur 07/14/2020 NEGATIVE  NEGATIVE Final   Comment:        THE SENSITIVITY OF THIS METHODOLOGY IS >24 mIU/mL   . Cholesterol  07/14/2020 142  0 - 200 mg/dL Final  . Triglycerides 07/14/2020 54  <150 mg/dL Final  . HDL 16/03/9603 43  >40 mg/dL Final  . Total CHOL/HDL Ratio 07/14/2020 3.3  RATIO Final  . VLDL 07/14/2020 11  0 - 40 mg/dL Final  . LDL Cholesterol 07/14/2020 88  0 - 99 mg/dL Final   Comment:         Total Cholesterol/HDL:CHD Risk Coronary Heart Disease Risk Table                     Men   Women  1/2 Average Risk   3.4   3.3  Average Risk       5.0   4.4  2 X Average Risk   9.6   7.1  3 X Average Risk  23.4   11.0        Use the calculated Patient Ratio above and the CHD Risk Table to determine the patient's CHD Risk.        ATP III CLASSIFICATION (LDL):  <100     mg/dL   Optimal  540-981  mg/dL   Near or Above                    Optimal  130-159  mg/dL   Borderline  191-478  mg/dL   High  >295     mg/dL   Very High Performed at [facility], [address], [place], [place] [address]   . Free T4 07/15/2020 0.95  0.61 - 1.12 ng/dL Final   Comment: (NOTE) Biotin ingestion may interfere with free T4 tests. If the results are inconsistent with the TSH level, previous test results, or the clinical presentation, then consider biotin interference. If needed, order repeat testing after stopping biotin. Performed at [facility], [address] [place], [place] [address]     Allergies: Patient has no known allergies.  PTA Medications: (Not in a hospital admission)   Medical Decision Making  Admission orders placed  TSH slightly elevated at 7.784, Free TF WNLs at 0.95  Initiate clonidine withdrawal protocol -clonidine taper -ondansetron 4 mg ODT every 6 hours prn nausea/vomiting -naproxen 500 mg BID prn for pain -dicyclomine 20 mg PO every 6 hours prn abdominal cramping -loperamide 2-4 mg capsule prn diarrhea -methocarbamol 500 mg PO every 8 hours prn spasms   Clinical Course as of 07/15/20 0344  Tue Jul 14, 2020  2313 POCT Urine Drug Screen - (ICup)(!) UDS positive for methamphetamines, cocaine, and morphine [JB]  2316 Normal sinus rhythm with sinus arrhythmia Normal ECG QT/QTc 410/426 ms [JB]  Wed Jul 15, 2020  0013 CBC with Differential/Platelet(!) Hgb slightly decreased, CBC otherwise unremarkable [JB]  0014 Comprehensive metabolic  panel(!) CMP unremarkable [JB]  0014 Lipid panel Lipid Panel unremarkable [JB]  0052 SARS Coronavirus 2 by RT PCR: NEGATIVE [JB]  0053 Preg Test, Ur: NEGATIVE [JB]  0053 TSH(!): 7.784 TSH 7.784, add free T4 [JB]  0343 T4,Free(Direct): 0.95 Free T4 WNLs [JB]    Clinical Course User Index [JB] [name], NP    Recommendations  Based on my evaluation the patient does not appear to have an emergency medical condition.   Recommend detox and substance abuse treatment. Patient will be placed in the continuous assessment area at [facility] for treatment and stabilization. She will be reevaluated on 07/15/2020. The treatment team will determine disposition at that time.     [name], NP  07/15/20  3:44 AM

## 2020-07-15 ENCOUNTER — Encounter (HOSPITAL_COMMUNITY): Payer: Self-pay | Admitting: Psychiatry

## 2020-07-15 ENCOUNTER — Inpatient Hospital Stay (HOSPITAL_COMMUNITY)
Admission: AD | Admit: 2020-07-15 | Discharge: 2020-07-17 | DRG: 885 | Disposition: A | Discharge status: Home / Self Care | Payer: Federal, State, Local not specified - Other | Source: Intra-hospital | Attending: Psychiatry | Admitting: Psychiatry

## 2020-07-15 DIAGNOSIS — F17210 Nicotine dependence, cigarettes, uncomplicated: Secondary | ICD-10-CM | POA: Diagnosis present

## 2020-07-15 DIAGNOSIS — F1113 Opioid abuse with withdrawal: Secondary | ICD-10-CM | POA: Diagnosis present

## 2020-07-15 DIAGNOSIS — R45851 Suicidal ideations: Secondary | ICD-10-CM

## 2020-07-15 DIAGNOSIS — Z813 Family history of other psychoactive substance abuse and dependence: Secondary | ICD-10-CM | POA: Diagnosis not present

## 2020-07-15 DIAGNOSIS — F1414 Cocaine abuse with cocaine-induced mood disorder: Secondary | ICD-10-CM | POA: Diagnosis present

## 2020-07-15 DIAGNOSIS — F1114 Opioid abuse with opioid-induced mood disorder: Secondary | ICD-10-CM | POA: Diagnosis present

## 2020-07-15 DIAGNOSIS — Z811 Family history of alcohol abuse and dependence: Secondary | ICD-10-CM | POA: Diagnosis not present

## 2020-07-15 DIAGNOSIS — F329 Major depressive disorder, single episode, unspecified: Secondary | ICD-10-CM | POA: Diagnosis present

## 2020-07-15 DIAGNOSIS — F322 Major depressive disorder, single episode, severe without psychotic features: Principal | ICD-10-CM | POA: Diagnosis present

## 2020-07-15 DIAGNOSIS — F1994 Other psychoactive substance use, unspecified with psychoactive substance-induced mood disorder: Secondary | ICD-10-CM | POA: Diagnosis not present

## 2020-07-15 DIAGNOSIS — F1910 Other psychoactive substance abuse, uncomplicated: Secondary | ICD-10-CM

## 2020-07-15 LAB — COMPREHENSIVE METABOLIC PANEL
ALT: 10 U/L (ref 0–44)
AST: 21 U/L (ref 15–41)
Albumin: 3.7 g/dL (ref 3.5–5.0)
Alkaline Phosphatase: 79 U/L (ref 38–126)
Anion gap: 10 (ref 5–15)
BUN: 10 mg/dL (ref 6–20)
CO2: 26 mmol/L (ref 22–32)
Calcium: 8.9 mg/dL (ref 8.9–10.3)
Chloride: 102 mmol/L (ref 98–111)
Creatinine, Ser: 0.54 mg/dL (ref 0.44–1.00)
GFR, Estimated: >60 mL/min (ref >60)
Glucose, Bld: 65 mg/dL — ABNORMAL LOW (ref 70–99)
Potassium: 3.9 mmol/L (ref 3.5–5.1)
Sodium: 138 mmol/L (ref 135–145)
Total Bilirubin: 0.7 mg/dL (ref 0.3–1.2)
Total Protein: 7 g/dL (ref 6.5–8.1)

## 2020-07-15 LAB — TSH: TSH: 7.784 u[IU]/mL — ABNORMAL HIGH (ref 0.350–4.500)

## 2020-07-15 LAB — LIPID PANEL
Cholesterol: 142 mg/dL (ref 0–200)
HDL: 43 mg/dL (ref >40)
LDL Cholesterol: 88 mg/dL (ref 0–99)
Total CHOL/HDL Ratio: 3.3 RATIO
Triglycerides: 54 mg/dL (ref <150)
VLDL: 11 mg/dL (ref 0–40)

## 2020-07-15 LAB — HEMOGLOBIN A1C
Hgb A1c MFr Bld: 5.5 % (ref 4.8–5.6)
Mean Plasma Glucose: 111.15 mg/dL

## 2020-07-15 LAB — T4, FREE: Free T4: 0.95 ng/dL (ref 0.61–1.12)

## 2020-07-15 LAB — RESP PANEL BY RT-PCR (FLU A&B, COVID) ARPGX2
Influenza A by PCR: NEGATIVE
Influenza B by PCR: NEGATIVE
SARS Coronavirus 2 by RT PCR: NEGATIVE

## 2020-07-15 MED ORDER — CLONIDINE HCL 0.1 MG PO TABS
0.1000 mg | ORAL_TABLET | ORAL | Status: DC | PRN
  Administered 2020-07-16: 0.1 mg via ORAL
  Filled 2020-07-15: qty 1

## 2020-07-15 MED ORDER — ACETAMINOPHEN 325 MG PO TABS
650.0000 mg | ORAL_TABLET | Freq: Four times a day (QID) | ORAL | Status: DC | PRN
  Administered 2020-07-16: 650 mg via ORAL
  Filled 2020-07-15: qty 2

## 2020-07-15 MED ORDER — TRAZODONE HCL 50 MG PO TABS
50.0000 mg | ORAL_TABLET | Freq: Every evening | ORAL | Status: DC | PRN
  Administered 2020-07-15 – 2020-07-16 (×2): 50 mg via ORAL
  Filled 2020-07-15 (×2): qty 1
  Filled 2020-07-15: qty 7

## 2020-07-15 MED ORDER — ONDANSETRON 4 MG PO TBDP
4.0000 mg | ORAL_TABLET | Freq: Three times a day (TID) | ORAL | Status: DC | PRN
  Administered 2020-07-15 – 2020-07-16 (×2): 4 mg via ORAL
  Filled 2020-07-15 (×2): qty 1

## 2020-07-15 MED ORDER — MAGNESIUM HYDROXIDE 400 MG/5ML PO SUSP: 30.0000 mL | Freq: Every day | ORAL | Status: DC | PRN

## 2020-07-15 MED ORDER — METHOCARBAMOL 750 MG PO TABS
750.0000 mg | ORAL_TABLET | Freq: Three times a day (TID) | ORAL | Status: DC | PRN
  Administered 2020-07-16: 750 mg via ORAL
  Filled 2020-07-15: qty 1

## 2020-07-15 MED ORDER — ALUM & MAG HYDROXIDE-SIMETH 200-200-20 MG/5ML PO SUSP: 30.0000 mL | ORAL | Status: DC | PRN

## 2020-07-15 MED ORDER — NICOTINE 21 MG/24HR TD PT24
21.0000 mg | MEDICATED_PATCH | Freq: Every day | TRANSDERMAL | Status: DC
  Administered 2020-07-15 – 2020-07-17 (×3): 21 mg via TRANSDERMAL
  Filled 2020-07-15 (×3): qty 1

## 2020-07-15 MED ORDER — LOPERAMIDE HCL 2 MG PO CAPS
2.0000 mg | ORAL_CAPSULE | ORAL | Status: DC | PRN
  Administered 2020-07-16: 2 mg via ORAL
  Filled 2020-07-15: qty 1

## 2020-07-15 MED ORDER — DICYCLOMINE HCL 10 MG PO CAPS
10.0000 mg | ORAL_CAPSULE | Freq: Four times a day (QID) | ORAL | Status: DC | PRN
  Administered 2020-07-15 – 2020-07-16 (×2): 10 mg via ORAL
  Filled 2020-07-15 (×2): qty 1

## 2020-07-15 MED ORDER — HYDROXYZINE HCL 25 MG PO TABS
25.0000 mg | ORAL_TABLET | Freq: Three times a day (TID) | ORAL | Status: DC | PRN
  Administered 2020-07-15 – 2020-07-16 (×2): 25 mg via ORAL
  Filled 2020-07-15: qty 1
  Filled 2020-07-15: qty 10
  Filled 2020-07-15: qty 1

## 2020-07-15 NOTE — ED Notes (Signed)
Pt resting on pull out with eyes closed unlabored respirations through night in no acute distress. Safety maintained.  

## 2020-07-15 NOTE — Discharge Instructions (Signed)
Transfer to [facility] °

## 2020-07-15 NOTE — Progress Notes (Signed)
Emily Logan is a 25 year old female being admitted voluntarily to 304-1.  She presented to [facility] yesterday as a walk in.  She appeared under the influence of substances as she was unable to keep her eyes open.  She was sent to [facility] for evaluation.  She reported suicidal ideation and was unable to contract for safety.  She did request to be started in Methadone but was recently discharged from the methadone clinic for using substances.  During [facility] admission, she was pleasant but cooperative.  She was drowsy during assessment and would close her eyes frequently.  She reported using heroin daily but denied any other substances.  She denied SI/HI or A/V hallucinations.  She verbally agrees to not harm herself on the unit.  She denied any pain or discomfort.  She did say she feels like she is going through withdrawals.  Oriented her to the unit.  Admission paperwork completed and signed.  Belongings searched and secured in locker #46, no contraband found.  Skin assessment completed and noted healing abscess on right antecubital area tattoo right upper shoulder.  Q 15 minute checks initiated for safety.  We will continue to monitor the progress towards her goals.

## 2020-07-15 NOTE — Progress Notes (Signed)
Pt accepted to [facility] 304-1  Meets inpatient criteria per Earlene Plater, MD.  Dr. Jola Babinski is the attending provider.    Call report to [phone]    Zella Ball, LPN @ [facility] notified in-person.     Pt is Voluntary.    Pt may be transported by safe transport.  Pt scheduled  [facility] at 1230 PM  Signed:  Corky Crafts, MSW, LCSWA, LCASA 07/15/2020 1:11 PM

## 2020-07-15 NOTE — ED Notes (Signed)
Pt resting with eyes closed. Rise and fall of chest noted. No new issues at this time. Will continue to monitor for safety 

## 2020-07-15 NOTE — Progress Notes (Signed)
Upon assessment, pt was resting in her bed. Pt reports feeling depressed and her ultimate goal being to get clean. She said that the longest she has been sober for was 1 year. Pt said that she maybe interested in attending a rehab center. Pt said that she had skipped dinner, so she was provided snacks and fluids. Pt did complain of withdrawal symptoms of nausea and abdominal spasms for which her PRN bentyl and Zofran were administered. Pt denies SI/HI and AVH. Active listening, reassurance, and support provided. Encouraged pt to attend groups and medications were administered as ordered by provider. Q 15 min safety checks continue. Pt's safety has been maintained.   07/15/20 2120  Psych Admission Type (Psych Patients Only)  Admission Status Voluntary  Psychosocial Assessment  Patient Complaints Depression;Substance abuse  Eye Contact Fair  Facial Expression Flat  Affect Anxious;Depressed;Flat;Appropriate to circumstance  Speech Logical/coherent  Interaction Minimal;Forwards little  Motor Activity Slow  Appearance/Hygiene Unremarkable  Behavior Characteristics Cooperative;Appropriate to situation;Anxious  Mood Depressed;Anxious  Thought Process  Coherency WDL  Content WDL  Delusions None reported or observed  Perception WDL  Hallucination None reported or observed  Judgment Impaired  Confusion None  Danger to Self  Current suicidal ideation? Denies  Danger to Others  Danger to Others None reported or observed

## 2020-07-15 NOTE — Progress Notes (Signed)
   07/15/20 2120  COVID-19 Daily Checkoff  Have you had a fever (temp > 37.80C/100F)  in the past 24 hours?  No  COVID-19 EXPOSURE  Have you traveled outside the state in the past 14 days? No  Have you been in contact with someone with a confirmed diagnosis of COVID-19 or PUI in the past 14 days without wearing appropriate PPE? No  Have you been living in the same home as a person with confirmed diagnosis of COVID-19 or a PUI (household contact)? No  Have you been diagnosed with COVID-19? No   

## 2020-07-15 NOTE — ED Notes (Signed)
Pt escorted to retrieve belongings. Ambulated per self. No s/s pain, discomfort, or acute distress. Endorsing SI and will not contract for safety. No HI or AVH. Escorted out back sallyport to safe transport for transportation to [facility].
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
## 2020-07-15 NOTE — Progress Notes (Signed)
Writer introduced self to the pt. Nicotine patch applied. Patient encouraged to stay hydrated and drink plenty of fluids as the clonidine will decrease her b/p. Pt denies any concerns at this time. 15 minute checks performed for safety.

## 2020-07-15 NOTE — Tx Team (Signed)
Initial Treatment Plan 07/15/2020 4:55 PM Emily Logan LXB:[id number]    PATIENT STRESSORS: Financial difficulties Medication change or noncompliance Substance abuse   PATIENT STRENGTHS: Wellsite geologist fund of knowledge Physical Health Supportive family/friends   PATIENT IDENTIFIED PROBLEMS: Depression  Substance abuse  Suicidal ideation                 DISCHARGE CRITERIA:  Improved stabilization in mood, thinking, and/or behavior Need for constant or close observation no longer present Reduction of life-threatening or endangering symptoms to within safe limits Verbal commitment to aftercare and medication compliance  PRELIMINARY DISCHARGE PLAN: Outpatient therapy  PATIENT/FAMILY INVOLVEMENT: This treatment plan has been presented to and reviewed with the patient, Emily Logan.  The patient and family have been given the opportunity to ask questions and make suggestions.  Levin Bacon, RN 07/15/2020, 4:55 PM

## 2020-07-16 DIAGNOSIS — F1910 Other psychoactive substance abuse, uncomplicated: Secondary | ICD-10-CM

## 2020-07-16 DIAGNOSIS — F1994 Other psychoactive substance use, unspecified with psychoactive substance-induced mood disorder: Secondary | ICD-10-CM

## 2020-07-16 DIAGNOSIS — R45851 Suicidal ideations: Secondary | ICD-10-CM

## 2020-07-16 DIAGNOSIS — F329 Major depressive disorder, single episode, unspecified: Secondary | ICD-10-CM

## 2020-07-16 MED ORDER — TRAZODONE HCL 100 MG PO TABS
100.0000 mg | ORAL_TABLET | Freq: Once | ORAL | Status: AC
  Administered 2020-07-16: 100 mg via ORAL
  Filled 2020-07-16 (×2): qty 1

## 2020-07-16 MED ORDER — BOOST / RESOURCE BREEZE PO LIQD CUSTOM
1.0000 | ORAL | Status: DC
  Administered 2020-07-16 – 2020-07-17 (×2): 1 via ORAL
  Filled 2020-07-16 (×3): qty 1

## 2020-07-16 MED ORDER — ADULT MULTIVITAMIN W/MINERALS CH
1.0000 | ORAL_TABLET | Freq: Every day | ORAL | Status: DC
  Administered 2020-07-16 – 2020-07-17 (×2): 1 via ORAL
  Filled 2020-07-16 (×5): qty 1

## 2020-07-16 MED ORDER — ONDANSETRON 4 MG PO TBDP
8.0000 mg | ORAL_TABLET | Freq: Three times a day (TID) | ORAL | Status: DC | PRN
  Administered 2020-07-16: 8 mg via ORAL
  Filled 2020-07-16: qty 2

## 2020-07-16 MED ORDER — ONDANSETRON 4 MG PO TBDP
4.0000 mg | ORAL_TABLET | Freq: Once | ORAL | Status: AC
  Administered 2020-07-16: 4 mg via ORAL
  Filled 2020-07-16 (×2): qty 1

## 2020-07-16 MED ORDER — ENSURE ENLIVE PO LIQD
237.0000 mL | ORAL | Status: DC
  Filled 2020-07-16 (×2): qty 237
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
## 2020-07-16 NOTE — Progress Notes (Signed)
NUTRITION ASSESSMENT RD working remotely.  Pt identified as at risk on the Malnutrition Screen Tool  INTERVENTION: - will order Boost Breeze once/day, each supplement provides 250 kcal and 9 grams of protein. - will order Ensure Enlive once/day, each supplement provides 350 kcal and 20 grams of protein.  NUTRITION DIAGNOSIS: Unintentional weight loss related to sub-optimal intake as evidenced by pt report.   Goal: Pt to meet >/= 90% of their estimated nutrition needs.  Monitor:  PO intake  Assessment:  Notes indicate that patient has hx of heroin use. She presented/was admitted due to worsening depression, SI, and wanting help concerning drug use.   Weight yesterday was 105 lb and no other weight recordings are available in the chart.     25 y.o. female  Height: Ht Readings from Last 1 Encounters:  07/15/20 5\' 3"  (1.6 m)    Weight: Wt Readings from Last 1 Encounters:  07/15/20 47.9 kg    Weight Hx: Wt Readings from Last 10 Encounters:  07/15/20 47.9 kg    BMI:  Body mass index is 18.69 kg/m. Pt meets criteria for borderline underweight based on current BMI.  Estimated Nutritional Needs: Kcal: 25-30 kcal/kg Protein: > 1 gram protein/kg Fluid: 1 ml/kcal  Diet Order:  Diet Order            Diet regular Room service appropriate? Yes; Fluid consistency: Thin  Diet effective now                Pt is also offered choice of unit snacks mid-morning and mid-afternoon.  Pt is eating as desired.   Lab results and medications reviewed.      09/12/20, MS, RD, LDN, CNSC Inpatient Clinical Dietitian RD pager # available in [facility]  After hours/weekend pager # available in [facility]

## 2020-07-16 NOTE — Progress Notes (Signed)
   07/16/20 1948  Psych Admission Type (Psych Patients Only)  Admission Status Voluntary  Psychosocial Assessment  Patient Complaints Substance abuse  Eye Contact Brief  Facial Expression Flat  Affect Anxious;Irritable;Labile  Speech Argumentative;Soft;Slow;Elective mutism  Interaction Assertive;Attention-seeking;Needy  Motor Activity Fidgety;Slow  Appearance/Hygiene Disheveled  Behavior Characteristics Anxious;Fidgety;Irritable  Mood Anxious;Labile;Irritable  Thought Process  Coherency WDL  Content WDL  Delusions None reported or observed  Perception WDL  Hallucination None reported or observed  Judgment Impaired  Confusion None  Danger to Self  Current suicidal ideation? Denies  Danger to Others  Danger to Others None reported or observed

## 2020-07-16 NOTE — Progress Notes (Signed)
MHT was doing 15 min safety checks.  Pt asked MHT to "come here, please.  I'm having little seizures."  MHT notified RN of pt's report of having "little seizures."  RN assessed pt.  Pt is able to speak clearly and rationally.  Pt is alert and oriented.  Pt is closing her eyes when speaking to RN.  Vitals signs are obtained.  BP: 112/66, HR 65, R 16, temperature 98.2.  O2 sats, 100 percent on room air.    RN has asked pt throughout the day if she needed any medications for withdrawals and pt's only complaint was nausea which improved with prn Zofran.    RN observed pt walking in the hallway with steady gait.  Charge Nurse notified.    RN has offered vistaril, robaxin, bentyl, repeatedly and pt declines these medications every time.

## 2020-07-16 NOTE — Progress Notes (Signed)
Pt completed 72 hour request for discharge form, and this RN witnessed pt's signatures and reviewed form with pt.
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
## 2020-07-16 NOTE — Progress Notes (Addendum)
Pt had placed herself on the floor. She was laying on her stomach on the floor with her eyes closed and she was moving her body some. Pt was assessed and complained that she was having a seizure. Pt was not showing any signs of any seizure activity. Pt is alert and oriented X 4. Her speech is clear and logical. She shows no signs of confusion. Pt was encouraged to get up of the floor and assistance was provided, but pt refused. Pt was informed that she was not having a seizure but she said she was. Pt's vitals were assessed while she was lying on the floor. Blood pressure of 132/73, pulse 63, respirations 17, and O2 100% on room air at 1948. Respirations are even and unlabored. Pt is labile and manipulative. While her vitals were assessed, she said that she wanted to get up of the floor now and was asked to wait until her vitals were being done assessed. Pt became loud and said that this writer was being rude. She would not stay still while her vitals were being taken and she was moving her legs around. Pt was fidgety. While still on the floor pt leaned over and started retching. Pt said she felt nauseous and was provided her trash can. No actual emesis observed. Pt then independently walked back over to her bed and laid down. Provided pt ginger ale, which pt only took a few sips of. Encouraged pt to drink fluids which she hasn't been. Pt has several cups of fluids sitting on her bench. Pt's gait remains steady and she continues on high fall risk prevention protocol. Pt c/o withdrawal symptoms of nausea, 7 episodes of vomit during the day, and 2 episodes of diarrhea. She also said she had generalized pain rated a 10 on a scale of 0-10 (10 being the highest). Day shift nurse reported pt feeling nauseous, but no episodes of vomiting. Pt was administered her PRN tylenol at 1959, imodium at 1959, and trazodone at 2000. Q 15 min safety checks continue. Pt's safety has been maintained.

## 2020-07-16 NOTE — Progress Notes (Signed)
Psychoeducational Group Note  Date:  07/16/2020 Time:  2015  Group Topic/Focus:  wrap up group  Participation Level: Did Not Attend  Participation Quality:  Not Applicable  Affect:  Not Applicable  Cognitive:  Not Applicable  Insight:  Not Applicable  Engagement in Group: Not Applicable  Additional Comments:  Pt was in bed during group time.   Marcille Buffy 07/16/2020, 8:43 PM

## 2020-07-16 NOTE — Progress Notes (Signed)
Pt continues to complain of having seizures when no such signs have been observed and she said that "it is so uncomfortable." Pt describes her withdrawals as "I feel like I'm dying." Pt's 50 mg dose of trazodone administered at 2000 was not effective in helping her sleep. Pt was administered her 0.1 mg of clonidine at 2128 for withdrawals. Her vitals were reassessed at 2149 and remain within her baseline. Pt also requested another dose of trazodone to help her sleep. NP, Nira Conn was informed and ordered a one time dose of 100 mg of trazodone. The one time dose was administered at 2150. Pt is laying in the middle of her bed without any covers on. Asked pt if she was feeling hot or cold. Pt said she felt cold. Her heat was turned on and an extra blanket was provided. Pt answers questions when she wants to and at times just chooses to be mute. Pt is attention seeking and needy. Asked pt if she was drinking any alcohol prior to admission and she said she wasn't. Q 15 min safety checks continue. Pt's safety has been maintained.

## 2020-07-16 NOTE — Progress Notes (Signed)
Pt said that n/v symptoms have improved.

## 2020-07-16 NOTE — Progress Notes (Signed)
Updated Nira Conn, NP of pt's behavior and PRNs administered for her withdrawal symptoms. Asked provider about pt's PRN clonidine 0.1 mg ordered for withdrawal associated HTN. Nira Conn, NP was informed of pt's last blood pressure and said that it is fine to administer it for her withdrawal symptoms.

## 2020-07-16 NOTE — Progress Notes (Signed)
Pt denies SI/HI/AVH.  Pt endorses feeling nauseated and vomiting.  4mg  of Zofran given.  Pt continued to endorse n/v and additional 4mg  zofran administered. Pt is guarded on approach and presents with flat affect and depressed mood.  Pt denies SI/HI/AVH.  RN established rapport with pt, and assessed for needs and concerns.  Pt is safe on the unit with q 15 min safety checks in place.

## 2020-07-16 NOTE — Progress Notes (Signed)
RN spoke to [place], [place]. PMHNP Instructed RN to give pt vistaril and 8mg  Zofran now.  Pt also  took PRN bentyl and robaxin.

## 2020-07-17 MED ORDER — HYDROXYZINE HCL 25 MG PO TABS: 25.0000 mg | ORAL_TABLET | Freq: Three times a day (TID) | ORAL | 0 refills | Status: DC | PRN

## 2020-07-17 MED ORDER — QUETIAPINE FUMARATE 50 MG PO TABS
50.0000 mg | ORAL_TABLET | Freq: Once | ORAL | Status: AC
  Administered 2020-07-17: 50 mg via ORAL
  Filled 2020-07-17 (×2): qty 1

## 2020-07-17 MED ORDER — TRAZODONE HCL 50 MG PO TABS
50.0000 mg | ORAL_TABLET | Freq: Every evening | ORAL | 0 refills | Status: DC | PRN
Start: 2020-07-17 — End: 2022-07-20

## 2020-07-17 NOTE — Progress Notes (Signed)
Recreation Therapy Notes  Date:  2.11.22 Time: 1005 Location: 500 Hall Dayroom  Group Topic: Stress Management  Goal Area(s) Addresses:  Patient will identify positive stress management techniques. Patient will identify benefits of using stress management post d/c.  Intervention: Stress Management  Activity: Meditation.  LRT played a meditation on being resilient in the face of adversity.  Patients were to listen as the meditation played to engage in the activity.    Education:  Stress Management, Discharge Planning.   Education Outcome: Acknowledges Education  Clinical Observations/Feedback: Pt did not attend group session.    Caroll Rancher, LRT/CTRS         Lillia Abed, Cataleya Cristina A 07/17/2020 11:13 AM

## 2020-07-17 NOTE — Progress Notes (Signed)
  [facility] Adult Case Management Discharge Plan :  Will you be returning to the same living situation after discharge:  Yes,  home with husband At discharge, do you have transportation home?: Yes,  via husband Do you have the ability to pay for your medications: No.  Release of information consent forms completed and in the chart;  Patient's signature needed at discharge.  Patient to Follow up at:  Follow-up Information    [facility] Go on 07/27/2020.   Specialty: Behavioral Health Why: You have a walk in appointment for therapy services on 07/27/20 at 7:45 am.  You also have a walk in appointment for medication management on 08/05/20 at 7:45 am.  Walk in appointments are first come, first served and are held in person. Contact information: [phone] [place] [address] [phone]       [facility]. Call.   Why: Please contact this provider if you would like to confirm or schedule an appointment, as we were unable to contact. Contact information: [address] [place], [place] [address]  P:  [phone] F:  [phone]              Next level of care provider has access to [facility] Link:yes  Safety Planning and Suicide Prevention discussed: Yes,  Luther Hearing (husband) [phone]  Have you used any form of tobacco in the last 30 days? (Cigarettes, Smokeless Tobacco, Cigars, and/or Pipes): Yes  Has patient been referred to the Quitline?: Patient refused referral  Patient has been referred for addiction treatment: Pt. refused referral  Felizardo Hoffmann, Theresia Majors 07/17/2020, 9:20 AM

## 2020-07-17 NOTE — BHH Suicide Risk Assessment (Signed)
[facility] Discharge Suicide Risk Assessment   Principal Problem: MDD (major depressive disorder) Discharge Diagnoses: Principal Problem:   MDD (major depressive disorder) Active Problems:   Substance induced mood disorder (HCC)   Suicidal ideation   Polysubstance abuse (HCC)   Total Time spent with patient: 20 minutes  Musculoskeletal: Strength & Muscle Tone: within normal limits Gait & Station: normal Patient leans: N/A  Psychiatric Specialty Exam:   Blood pressure 119/73, pulse (!) 58, temperature 98.2 F (36.8 C), temperature source Oral, resp. rate 18, height 5\' 3"  (1.6 m), weight 47.9 kg, SpO2 100 %.Body mass index is 18.69 kg/m.  General Appearance: Casual  Eye Contact::  Fair  Speech:  Clear and Coherent  Volume:  Normal  Mood:  Euthymic  Affect:  Appropriate  Thought Process:  Coherent  Orientation:  Full (Time, Place, and Person)  Thought Content:  Logical  Suicidal Thoughts:  No  Homicidal Thoughts:  No  Memory:  Recent;   Fair  Judgement:  Fair  Insight:  Fair  Psychomotor Activity:  Normal  Concentration:  Fair  Recall:  [id number] of Knowledge:Fair  Language: Fair  Akathisia:  No  Handed:  Right  AIMS (if indicated):     Assets:  Desire for Improvement Housing Intimacy Leisure Time Physical Health Resilience Social Support  Sleep:  Number of Hours: 4  Cognition: WNL  ADL's:  Intact   Mental Status Per Nursing Assessment::   On Admission:  Suicidal ideation indicated by patient  Demographic Factors:  Caucasian and Low socioeconomic status  Loss Factors: NA  Historical Factors: Family history of mental illness or substance abuse and Impulsivity  Risk Reduction Factors:   Living with another person, especially a relative, Positive social support, and Positive therapeutic relationship  Continued Clinical Symptoms:  Alcohol/Substance Abuse/Dependencies  Cognitive Features That Contribute To Risk:  Closed-mindedness    Suicide Risk:   Moderate:  Frequent suicidal ideation with limited intensity, and duration, some specificity in terms of plans, no associated intent, good self-control, limited dysphoria/symptomatology, some risk factors present, and identifiable protective factors, including available and accessible social support.   Follow-up Information     [facility]. Go on 07/27/2020.   Specialty: Behavioral Health Why: You have a walk in appointment for therapy services on 07/27/20 at 7:45 am.  You also have a walk in appointment for medication management on 08/05/20 at 7:45 am.  Walk in appointments are first come, first served and are held in person. Contact information: [address] [place] [phone]        [facility]. Call.   Why: Please contact this provider if you would like to confirm or schedule an appointment, as we were unable to contact. Contact information: [address] [place], [place]  P:  [phone] F:  [phone]                Plan Of Care/Follow-up recommendations:  Other:  Follow-up with outpatient care  [phone], MD 07/17/2020, 10:10 AM

## 2020-07-17 NOTE — BHH Suicide Risk Assessment (Signed)
BHH INPATIENT:  Family/Significant Other Suicide Prevention Education  Suicide Prevention Education:  Education Completed; Emily Logan (husband) [phone],  (name of family member/significant other) has been identified by the patient as the family member/significant other with whom the patient will be residing, and identified as the person(s) who will aid the patient in the event of a mental health crisis (suicidal ideations/suicide attempt).  With written consent from the patient, the family member/significant other has been provided the following suicide prevention education, prior to the and/or following the discharge of the patient.  The suicide prevention education provided includes the following:  Suicide risk factors  Suicide prevention and interventions  National Suicide Hotline telephone number  [facility] assessment telephone number  [facility] Emergency Assistance 911  [facility] and/or Residential Mobile Crisis Unit telephone number  Request made of family/significant other to:  Remove weapons (e.g., guns, rifles, knives), all items previously/currently identified as safety concern.    Remove drugs/medications (over-the-counter, prescriptions, illicit drugs), all items previously/currently identified as a safety concern.  The family member/significant other verbalizes understanding of the suicide prevention education information provided.  The family member/significant other agrees to remove the items of safety concern listed above.  "It hasn't been long enough. She is just going to put me in the middle. While she was at your facility I didn't have to go get crack or heroin for her. She takes all my money. You shouldn't have let her sign a 72 hour." Pt's husband was very upset about pt being discharged from [facility] today. CSW explained that pt had signed a 72 hour and is voluntary. Caller stated he would be here at 11am to pick her up.   Emily Logan 07/17/2020, 9:33 AM

## 2020-07-17 NOTE — BHH Group Notes (Signed)
Adult Psychoeducational Group Note  Date:  07/17/2020 Time:  9:04 AM  Group Topic/Focus:  Goals Group:   The focus of this group is to help patients establish daily goals to achieve during treatment and discuss how the patient can incorporate goal setting into their daily lives to aide in recovery.  Participation Level:  Did Not Attend Deforest Hoyles [facility] 07/17/2020, 9:04 AM

## 2020-07-17 NOTE — Discharge Summary (Signed)
Physician Discharge Summary Note  Patient:  Emily Logan is an 25 y.o., female MRN:  [id number] DOB:  07/11/1995 Patient phone:  [phone] (home)  Patient address:   [address] [place] [address],  Total Time spent with patient: 30 minutes  Date of Admission:  07/15/2020 Date of Discharge: 0211/2022  Reason for Admission:  Exacerbation of MDD, Suicidal ideation  Principal Problem: MDD (major depressive disorder) Discharge Diagnoses: Principal Problem:   MDD (major depressive disorder) Active Problems:   Substance induced mood disorder (HCC)   Suicidal ideation   Polysubstance abuse (HCC)   Past Psychiatric History: Polysubstance abuse; 2 past psychiatric hospitalizations  Past Medical History: History reviewed. No pertinent past medical history. History reviewed. No pertinent surgical history. Family History: History reviewed. No pertinent family history. Family Psychiatric  History: None reported Social History: Married, lives with husband Social History   Substance and Sexual Activity  Alcohol Use Never     Social History   Substance and Sexual Activity  Drug Use Yes  . Types: Heroin, "Crack" cocaine, Methamphetamines   Comment: meth 1st use 2/7, crack $20/day, heroin 0.5-1G/day    Social History   Socioeconomic History  . Marital status: Single    Spouse name: Not on file  . Number of children: Not on file  . Years of education: Not on file  . Highest education level: Not on file  Occupational History  . Not on file  Tobacco Use  . Smoking status: Current Every Day Smoker    Packs/day: 1.00    Types: Cigarettes  . Smokeless tobacco: Never Used  Vaping Use  . Vaping Use: Never used  Substance and Sexual Activity  . Alcohol use: Never  . Drug use: Yes    Types: Heroin, "Crack" cocaine, Methamphetamines    Comment: meth 1st use 2/7, crack $20/day, heroin 0.5-1G/day  . Sexual activity: Yes    Birth control/protection: None  Other  Topics Concern  . Not on file  Social History Narrative  . Not on file   Social Determinants of Health   Financial Resource Strain: Not on file  Food Insecurity: Not on file  Transportation Needs: Not on file  Physical Activity: Not on file  Stress: Not on file  Social Connections: Not on file    Hospital Course: (Per H&P, 07/16/2020): "Mrs. Logan is a 25 yr old female who presents with MDD, SI, and polysubstance use. PPHx is significant for Polysubstance use and 2 previous hospitalizations. When asked what brought her here she reports that she wants to go to Rehab. She states she has been to rehab before. She reports she has been using for about 7 years. She reports that she uses IV heroine daily and uses crack cocaine daily ($10 a day). At this point she reported she was nauseous and leaned over the trash can. At that point I suggested we continue the interview in her room so she could lay down. She agreed to this point at this time she began getting agitated and did not want to answer further questions. She then denied all- SI, HI, and AVH. She asked if she could be discharged. But then rolled over in bed and did not answer any more questions."  Following admission to [facility], [name] was placed on Opioid withdrawal protocol for management of symptoms. She had somatic complaints of nausea which were addressed with prn medication and support. Vital signs were monitored and all needs were addressed. She was noted to  have had a lower heart rate (58), but was not in any acute distress. [name] requested release from the hospital via signing a 72-hour release, stating that she is no longer suicidal. There are no clinical symptoms that supports continued admission. Therefore, patient is being discharged as requested. She did not participate in groups. Safety has been maintained at 15 minute checks. No incidents of forced medications or seclusion.  On interview today, patient denies any suicidal  ideation, homicidal ideation, or hallucinations. Patient does not appear to be responding to internal stimuli. Patient was discharged from [facility] without distress.    Physical Findings: AIMS: Facial and Oral Movements Muscles of Facial Expression: None, normal Lips and Perioral Area: None, normal Jaw: None, normal Tongue: None, normal,Extremity Movements Upper (arms, wrists, hands, fingers): None, normal Lower (legs, knees, ankles, toes): None, normal, Trunk Movements Neck, shoulders, hips: None, normal, Overall Severity Severity of abnormal movements (highest score from questions above): None, normal Incapacitation due to abnormal movements: None, normal Patient's awareness of abnormal movements (rate only patient's report): No Awareness, Dental Status Current problems with teeth and/or dentures?: No Does patient usually wear dentures?: No  CIWA:    COWS:  COWS Total Score: 7  Musculoskeletal: Strength & Muscle Tone: within normal limits Gait & Station: normal Patient leans: N/A  Psychiatric Specialty Exam: Physical Exam Vitals and nursing note reviewed.  HENT:     Head: Normocephalic.     Nose: No congestion or rhinorrhea.  Cardiovascular:     Rate and Rhythm: Normal rate.  Pulmonary:     Effort: Pulmonary effort is normal.  Genitourinary:    Comments: Deferred Musculoskeletal:        General: Normal range of motion.     Cervical back: Normal range of motion.  Neurological:     Mental Status: She is alert and oriented to person, place, and time.     Review of Systems  All other systems reviewed and are negative.   Blood pressure 119/73, pulse (!) 58, temperature 98.2 F (36.8 C), temperature source Oral, resp. rate 18, height 5\' 3"  (1.6 m), weight 47.9 kg, SpO2 100 %.Body mass index is 18.69 kg/m.  SEE MD'S SRA NOTE  Sleep:  Number of Hours: 4   Have you used any form of tobacco in the last 30 days? (Cigarettes, Smokeless Tobacco, Cigars, and/or Pipes): Yes  Has  this patient used any form of tobacco in the last 30 days? (Cigarettes, Smokeless Tobacco, Cigars, and/or Pipes)  Yes, an FDA-approved tobacco cessation medication was recommended at discharge.    Blood Alcohol level:  No results found for: [facility]  Metabolic Disorder Labs:  Lab Results  Component Value Date   HGBA1C 5.5 07/14/2020   MPG 111.15 07/14/2020   No results found for: PROLACTIN Lab Results  Component Value Date   CHOL 142 07/14/2020   TRIG 54 07/14/2020   HDL 43 07/14/2020   CHOLHDL 3.3 07/14/2020   VLDL 11 07/14/2020   LDLCALC 88 07/14/2020    See Psychiatric Specialty Exam and Suicide Risk Assessment completed by Attending Physician prior to discharge.  Discharge destination:  Home  Is patient on multiple antipsychotic therapies at discharge:  No   Has Patient had three or more failed trials of antipsychotic monotherapy by history:  No  Recommended Plan for Multiple Antipsychotic Therapies: NA   Allergies as of 07/17/2020   No Known Allergies     Medication List    TAKE these medications     Indication  hydrOXYzine 25 MG tablet Commonly known as: ATARAX/VISTARIL Take 1 tablet (25 mg total) by mouth 3 (three) times daily as needed for anxiety.  Indication: Feeling Anxious   traZODone 50 MG tablet Commonly known as: DESYREL Take 1 tablet (50 mg total) by mouth at bedtime as needed for sleep.  Indication: Trouble Sleeping       Follow-up Information    [facility]. Go on 07/27/2020.   Specialty: Behavioral Health Why: You have a walk in appointment for therapy services on 07/27/20 at 7:45 am.  You also have a walk in appointment for medication management on 08/05/20 at 7:45 am.  Walk in appointments are first come, first served and are held in person. Contact information: [address] [place] [address] [phone]       [facility]. Call.   Why: Please contact this provider if you would like to confirm or schedule an  appointment, as we were unable to contact. Contact information: [address] [place], [place] [address]  P:  [phone] F:  [phone]              Follow-up recommendations:  Follow up with outpatient provider for all your medical care needs. Activity as tolerated. Diet as recommended by your outpatient provider.  Comments:  Take all of your medications as prescribed   Report any side effects to your outpatient provider promptly.  Refrain from alcohol and illegal drug use while taking medications.  In the case of emergency call 911 or go to the nearest emergency department for evaluation/treatment  Signed: Vanetta Mulders, NP 07/17/2020, 1:26 PM

## 2020-07-17 NOTE — Progress Notes (Signed)
Pt has been restless. Pt has been tossing and turning in bed. She has been resting her legs on the foot of the bed. So far pt has only slept a total of 1.5 hours. Informed NP, Nira Conn. Order entered for one time dose of 50 mg of Seroquel.

## 2020-07-17 NOTE — Tx Team (Signed)
Interdisciplinary Treatment and Diagnostic Plan Update  07/17/2020 Time of Session: 9:05am   Emily Logan MRN: [id number]  Principal Diagnosis: MDD (major depressive disorder)  Secondary Diagnoses: Principal Problem:   MDD (major depressive disorder) Active Problems:   Substance induced mood disorder (HCC)   Suicidal ideation   Polysubstance abuse (HCC)   Current Medications:  Current Facility-Administered Medications  Medication Dose Route Frequency Provider Last Rate Last Admin  . acetaminophen (TYLENOL) tablet 650 mg  650 mg Oral Q6H PRN Antonieta Pert, MD   650 mg at 07/16/20 1959  . alum & mag hydroxide-simeth (MAALOX/MYLANTA) 200-200-20 MG/5ML suspension 30 mL  30 mL Oral Q4H PRN Antonieta Pert, MD      . cloNIDine (CATAPRES) tablet 0.1 mg  0.1 mg Oral Q4H PRN Estella Husk, MD   0.1 mg at 07/16/20 2128  . dicyclomine (BENTYL) capsule 10 mg  10 mg Oral Q6H PRN Estella Husk, MD   10 mg at 07/16/20 1914  . feeding supplement (BOOST / RESOURCE BREEZE) liquid 1 Container  1 Container Oral Q24H Antonieta Pert, MD   1 Container at 07/17/20 [phone]  . feeding supplement (ENSURE ENLIVE / ENSURE PLUS) liquid 237 mL  237 mL Oral Q24H Antonieta Pert, MD      . hydrOXYzine (ATARAX/VISTARIL) tablet 25 mg  25 mg Oral TID PRN Antonieta Pert, MD   25 mg at 07/16/20 1914  . loperamide (IMODIUM) capsule 2 mg  2 mg Oral PRN Estella Husk, MD   2 mg at 07/16/20 1959  . magnesium hydroxide (MILK OF MAGNESIA) suspension 30 mL  30 mL Oral Daily PRN Antonieta Pert, MD      . methocarbamol (ROBAXIN) tablet 750 mg  750 mg Oral Q8H PRN Estella Husk, MD   750 mg at 07/16/20 1914  . multivitamin with minerals tablet 1 tablet  1 tablet Oral Daily Antonieta Pert, MD   1 tablet at 07/17/20 0900  . nicotine (NICODERM CQ - dosed in mg/24 hours) patch 21 mg  21 mg Transdermal Daily Antonieta Pert, MD   21 mg at 07/17/20 0900  . ondansetron  (ZOFRAN-ODT) disintegrating tablet 8 mg  8 mg Oral Q8H PRN Armandina Stammer I, NP   8 mg at 07/16/20 1917  . traZODone (DESYREL) tablet 50 mg  50 mg Oral QHS PRN Antonieta Pert, MD   50 mg at 07/16/20 2000   PTA Medications: No medications prior to admission.    Patient Stressors: Financial difficulties Medication change or noncompliance Substance abuse  Patient Strengths: Wellsite geologist fund of knowledge Physical Health Supportive family/friends  Treatment Modalities: Medication Management, Group therapy, Case management,  1 to 1 session with clinician, Psychoeducation, Recreational therapy.   Physician Treatment Plan for Primary Diagnosis: MDD (major depressive disorder) Long Term Goal(s): Improvement in symptoms so as ready for discharge Improvement in symptoms so as ready for discharge   Short Term Goals: Ability to identify changes in lifestyle to reduce recurrence of condition will improve Ability to verbalize feelings will improve Ability to disclose and discuss suicidal ideas Ability to demonstrate self-control will improve Ability to identify and develop effective coping behaviors will improve Ability to identify triggers associated with substance abuse/mental health issues will improve Ability to identify changes in lifestyle to reduce recurrence of condition will improve Ability to verbalize feelings will improve Ability to disclose and discuss suicidal ideas Ability to demonstrate self-control will improve Ability to  identify and develop effective coping behaviors will improve Ability to identify triggers associated with substance abuse/mental health issues will improve  Medication Management: Evaluate patient's response, side effects, and tolerance of medication regimen.  Therapeutic Interventions: 1 to 1 sessions, Unit Group sessions and Medication administration.  Evaluation of Outcomes: Adequate for Discharge  Physician Treatment Plan for  Secondary Diagnosis: Principal Problem:   MDD (major depressive disorder) Active Problems:   Substance induced mood disorder (HCC)   Suicidal ideation   Polysubstance abuse (HCC)  Long Term Goal(s): Improvement in symptoms so as ready for discharge Improvement in symptoms so as ready for discharge   Short Term Goals: Ability to identify changes in lifestyle to reduce recurrence of condition will improve Ability to verbalize feelings will improve Ability to disclose and discuss suicidal ideas Ability to demonstrate self-control will improve Ability to identify and develop effective coping behaviors will improve Ability to identify triggers associated with substance abuse/mental health issues will improve Ability to identify changes in lifestyle to reduce recurrence of condition will improve Ability to verbalize feelings will improve Ability to disclose and discuss suicidal ideas Ability to demonstrate self-control will improve Ability to identify and develop effective coping behaviors will improve Ability to identify triggers associated with substance abuse/mental health issues will improve     Medication Management: Evaluate patient's response, side effects, and tolerance of medication regimen.  Therapeutic Interventions: 1 to 1 sessions, Unit Group sessions and Medication administration.  Evaluation of Outcomes: Adequate for Discharge   RN Treatment Plan for Primary Diagnosis: MDD (major depressive disorder) Long Term Goal(s): Knowledge of disease and therapeutic regimen to maintain health will improve  Short Term Goals: Ability to remain free from injury will improve, Ability to demonstrate self-control, Ability to participate in decision making will improve, Ability to verbalize feelings will improve, Ability to disclose and discuss suicidal ideas and Ability to identify and develop effective coping behaviors will improve  Medication Management: RN will administer medications as  ordered by provider, will assess and evaluate patient's response and provide education to patient for prescribed medication. RN will report any adverse and/or side effects to prescribing provider.  Therapeutic Interventions: 1 on 1 counseling sessions, Psychoeducation, Medication administration, Evaluate responses to treatment, Monitor vital signs and CBGs as ordered, Perform/monitor CIWA, COWS, AIMS and Fall Risk screenings as ordered, Perform wound care treatments as ordered.  Evaluation of Outcomes: Adequate for Discharge   LCSW Treatment Plan for Primary Diagnosis: MDD (major depressive disorder) Long Term Goal(s): Safe transition to appropriate next level of care at discharge, Engage patient in therapeutic group addressing interpersonal concerns.  Short Term Goals: Engage patient in aftercare planning with referrals and resources, Increase social support, Increase emotional regulation, Facilitate acceptance of mental health diagnosis and concerns, Facilitate patient progression through stages of change regarding substance use diagnoses and concerns and Identify triggers associated with mental health/substance abuse issues  Therapeutic Interventions: Assess for all discharge needs, 1 to 1 time with Social worker, Explore available resources and support systems, Assess for adequacy in community support network, Educate family and significant other(s) on suicide prevention, Complete Psychosocial Assessment, Interpersonal group therapy.  Evaluation of Outcomes: Adequate for Discharge   Progress in Treatment: Attending groups: No. Participating in groups: No. Taking medication as prescribed: Yes. Toleration medication: Yes. Family/Significant other contact made: Yes, individual(s) contacted:  Husband  Patient understands diagnosis: Yes. and No. Discussing patient identified problems/goals with staff: Yes. Medical problems stabilized or resolved: Yes. Denies suicidal/homicidal ideation:  Yes. Issues/concerns per patient  self-inventory: No.   New problem(s) identified: No, Describe:  None   New Short Term/Long Term Goal(s): medication stabilization, elimination of SI thoughts, development of comprehensive mental wellness plan.   Patient Goals:  Patient did not attend   Discharge Plan or Barriers: Pt will return home with husband and will follow up with [facility]  Reason for Continuation of Hospitalization: Medication stabilization  Estimated Length of Stay: Adequate for discharge   Attendees: Patient: Did not attend 07/17/2020   Physician: Pricilla Larsson, MD 07/17/2020   Nursing:  07/17/2020   RN Care Manager: 07/17/2020   Social Worker: Melba Coon, LCSWA  07/17/2020   Recreational Therapist:  07/17/2020   Other:  07/17/2020   Other:  07/17/2020   Other: 07/17/2020     Scribe for Treatment Team: Aram Beecham, LCSWA 07/17/2020 10:50 AM

## 2020-07-17 NOTE — Progress Notes (Signed)
When this writer went into this pt's room to administer her one time dose of 50 mg of Seroquel it was also observed that her nose had bled. She had dried up blood under her left nostril and blood on her fitted sheet. Offered to change her fitted sheet, but she said that she didn't feel like getting up at this time. Pt said that it is fine if we change it in the morning. At first she refused to take the medicine, but after education and support she did at 69. Pt said that she wanted her door shut which was shut upon leaving. After the tech exited her room, she yelled "ma'am." She said to turn the bathrooms light off and shut her door. Pt was informed that it had already been shut. Q 15 min safety checks continue. Pt's safety has been maintained.

## 2020-07-17 NOTE — Progress Notes (Signed)
Pt discharged to lobby. Pt was stable and appreciative at that time. All papers, samples and prescriptions were given and valuables returned. Verbal understanding expressed. Denies SI/HI and A/VH. Pt given opportunity to express concerns and ask questions.  

## 2020-07-17 NOTE — Progress Notes (Signed)
Pt is currently sleeping in bed with her eyes closed. Respirations are even and unlabored. No distress has been observed. Q 15 min safety checks continue. Pt's safety has been maintained.

## 2020-07-17 NOTE — Progress Notes (Signed)
Pt is currently resting in bed with her eyes closed. Respirations are even and unlabored. Pt doesn't appear to be fidgety at this time. No distress has been observed. Q 15 min safety checks continue. Pt's safety has been maintained.

## 2020-07-17 NOTE — Progress Notes (Signed)
Discharge Note:  Patient denied SI and HI, contracts for safety.  Denied A/V hallucinations.  Denied pain.  Suicide prevention information given and discussed with patient who stated she understood and had no questions.  Patient stated she received all her belongings, clothing, misc items, etc.  Patient stated she appreciated all assistance received from [facility] staff.  All required discharge information given to patient at discharge.

## 2020-07-20 ENCOUNTER — Encounter (HOSPITAL_COMMUNITY): Payer: Self-pay

## 2021-05-05 ENCOUNTER — Other Ambulatory Visit: Payer: Self-pay

## 2021-05-05 ENCOUNTER — Other Ambulatory Visit: Payer: Self-pay | Admitting: *Deleted

## 2021-05-05 DIAGNOSIS — Z124 Encounter for screening for malignant neoplasm of cervix: Secondary | ICD-10-CM

## 2021-05-05 NOTE — Addendum Note (Signed)
Addended by: Narda Rutherford on: 05/05/2021 12:04 PM   Modules accepted: Orders

## 2021-05-05 NOTE — Progress Notes (Signed)
Patient: Emily Logan           Date of Birth: 09-Nov-1995           MRN: [id number] Visit Date: 05/05/2021 PCP: Patient, No Pcp Per (Inactive)  Cervical Cancer Screening Do you smoke?: No (Former smoker, quit x 4 months ago, currently vapes) Have you ever had or been told you have an allergy to latex products?: No Marital status: Single (Separated) Date of last pap smear: Never Number of pregnancies: 0 Number of births: 0 Have you ever had any of the following? Hysterectomy: No Tubal ligation (tubes tied): No Abnormal bleeding: No Abnormal pap smear: No Venereal warts: No A sex partner with venereal warts: No A high risk* sex partner: No  Cervical Exam  Abnormal Observations: Normal Exam. Recommendations: Per patient has never had a Pap smear completed. No Pap smear results are in Epic. Let patient know that will follow-up with her within the next couple of weeks with the results of her Pap smear by phone. Informed patient that if today's Pap smear is normal that her next Pap smear will be due in 3 years per ASCCP guidelines. Patient complained of Amenorrhea the past several months and stated that she was told that birth control could help regulate her cycle. Told patient she can follow-up at the [facility] Department for birth control and recommended her to take a UPT to ensure she is not pregnant. Patient verbalized understanding.      Patient's History Patient Active Problem List   Diagnosis Date Noted   Suicidal ideation 07/16/2020   Polysubstance abuse (HCC) 07/16/2020   MDD (major depressive disorder) 07/16/2020   Substance induced mood disorder (HCC) 07/15/2020   No past medical history on file.  No family history on file.  Social History   Occupational History   Not on file  Tobacco Use   Smoking status: Every Day    Packs/day: 1.00    Types: Cigarettes   Smokeless tobacco: Never  Vaping Use   Vaping Use: Never used  Substance and Sexual  Activity   Alcohol use: Never   Drug use: Yes    Types: Heroin, "Crack" cocaine, Methamphetamines, IV, Cocaine    Comment: heroin   Sexual activity: Yes    Birth control/protection: None

## 2021-05-07 LAB — CYTOLOGY - PAP: Diagnosis: NEGATIVE

## 2021-05-10 ENCOUNTER — Telehealth: Payer: Self-pay

## 2021-05-10 NOTE — Telephone Encounter (Signed)
Attempted to contact patient, regarding pap results. Left message on voicemail requesting a return call.

## 2021-05-11 ENCOUNTER — Telehealth: Payer: Self-pay

## 2021-05-11 NOTE — Telephone Encounter (Signed)
Left message on voicemail requesting return call.

## 2021-05-12 ENCOUNTER — Telehealth: Payer: Self-pay

## 2021-05-12 NOTE — Telephone Encounter (Signed)
Left message on voicemail requesting return call.

## 2021-05-27 ENCOUNTER — Telehealth: Payer: Self-pay

## 2021-05-27 NOTE — Telephone Encounter (Signed)
Left message on voicemail requesting a return call. (Attempted to contact patient regarding pap test results).

## 2021-06-02 ENCOUNTER — Telehealth: Payer: Self-pay

## 2021-06-02 NOTE — Telephone Encounter (Signed)
Normal Pap results letter mailed.

## 2021-06-10 ENCOUNTER — Ambulatory Visit (HOSPITAL_COMMUNITY)
Admission: EM | Admit: 2021-06-10 | Discharge: 2021-06-10 | Disposition: A | Discharge status: Home / Self Care | Payer: Medicaid Other | Attending: Student | Admitting: Student

## 2021-06-10 ENCOUNTER — Encounter (HOSPITAL_COMMUNITY): Payer: Self-pay | Admitting: Emergency Medicine

## 2021-06-10 ENCOUNTER — Other Ambulatory Visit: Payer: Self-pay

## 2021-06-10 DIAGNOSIS — K047 Periapical abscess without sinus: Secondary | ICD-10-CM

## 2021-06-10 MED ORDER — AMOXICILLIN 875 MG PO TABS: 875.0000 mg | ORAL_TABLET | Freq: Two times a day (BID) | ORAL | 0 refills | Status: AC

## 2021-06-10 NOTE — ED Triage Notes (Signed)
Patient c/o RT sided facial swelling x 2 days ago.   Patient endorses dental pain.   Patient denies previous issues with dental problems.   Patient endorses symptoms worsen at night.   Patient has taken ibuprofen with no relief of symptoms.

## 2021-06-10 NOTE — ED Provider Notes (Signed)
[facility]    CSN: [id number] Arrival date & time: 06/10/21  1013      History   Chief Complaint Chief Complaint  Patient presents with   Facial Swelling    HPI Emily Logan is a 26 y.o. female presenting with facial swelling and dental pain. Medical history substance abuse. Per triage note - "Patient c/o RT sided facial swelling x 2 days ago. Patient endorses dental pain. Patient denies previous issues with dental problems. Patient endorses symptoms worsen at night. Patient has taken ibuprofen with no relief of symptoms." Denies pain under tongue/ jaw, foul taste in mouth, fevers/chills.     HPI  History reviewed. No pertinent past medical history.  Patient Active Problem List   Diagnosis Date Noted   Suicidal ideation 07/16/2020   Polysubstance abuse (HCC) 07/16/2020   MDD (major depressive disorder) 07/16/2020   Substance induced mood disorder (HCC) 07/15/2020    History reviewed. No pertinent surgical history.  OB History   No obstetric history on file.      Home Medications    Prior to Admission medications   Medication Sig Start Date End Date Taking? Authorizing Provider  amoxicillin (AMOXIL) 875 MG tablet Take 1 tablet (875 mg total) by mouth 2 (two) times daily for 7 days. 06/10/21 06/17/21 Yes Rhys Martini, PA-C  methadone (DOLOPHINE) 10 MG/5ML solution Take by mouth every 6 (six) hours as needed for pain.   Yes [provider]  hydrOXYzine (ATARAX/VISTARIL) 25 MG tablet Take 1 tablet (25 mg total) by mouth 3 (three) times daily as needed for anxiety. 07/17/20   Vanetta Mulders, NP  naproxen (NAPROSYN) 500 MG tablet Take 1 tablet (500 mg total) by mouth 2 (two) times daily with a meal. 11/08/19   Wallis Bamberg, PA-C  traZODone (DESYREL) 50 MG tablet Take 1 tablet (50 mg total) by mouth at bedtime as needed for sleep. 07/17/20   Vanetta Mulders, NP    Family History History reviewed. No pertinent family history.  Social  History Social History   Tobacco Use   Smoking status: Every Day    Packs/day: 1.00    Types: Cigarettes   Smokeless tobacco: Never  Vaping Use   Vaping Use: Never used  Substance Use Topics   Alcohol use: Never   Drug use: Yes    Types: Heroin, "Crack" cocaine, Methamphetamines, IV, Cocaine    Comment: heroin     Allergies   Patient has no known allergies.   Review of Systems Review of Systems  HENT:  Positive for dental problem.   All other systems reviewed and are negative.   Physical Exam Triage Vital Signs ED Triage Vitals  Enc Vitals Group     BP 06/10/21 1123 108/62     Pulse Rate 06/10/21 1123 84     Resp 06/10/21 1123 18     Temp 06/10/21 1123 98.9 F (37.2 C)     Temp Source 06/10/21 1123 Oral     SpO2 06/10/21 1123 99 %     Weight --      Height --      Head Circumference --      Peak Flow --      Pain Score 06/10/21 1121 8     Pain Loc --      Pain Edu? --      Excl. in GC? --    No data found.  Updated Vital Signs BP 108/62 (BP Location: Right Arm)  Pulse 84    Temp 98.9 F (37.2 C) (Oral)    Resp 18    LMP  (LMP Unknown)    SpO2 99%   Visual Acuity Right Eye Distance:   Left Eye Distance:   Bilateral Distance:    Right Eye Near:   Left Eye Near:    Bilateral Near:     Physical Exam Vitals reviewed.  Constitutional:      General: She is not in acute distress.    Appearance: Normal appearance. She is not ill-appearing, toxic-appearing or diaphoretic.  HENT:     Head: Normocephalic and atraumatic.     Jaw: There is normal jaw occlusion. No trismus, tenderness, swelling, pain on movement or malocclusion.     Salivary Glands: Right salivary gland is not diffusely enlarged or tender. Left salivary gland is not diffusely enlarged or tender.     Right Ear: Hearing normal.     Left Ear: Hearing normal.     Nose: Nose normal.     Mouth/Throat:     Lips: Pink.     Mouth: Mucous membranes are moist. No lacerations or oral lesions.      Dentition: Abnormal dentition. Does not have dentures. Dental tenderness and dental caries present.     Tongue: No lesions. Tongue does not deviate from midline.     Palate: No mass.     Pharynx: Oropharynx is clear. Uvula midline. No oropharyngeal exudate or posterior oropharyngeal erythema.     Tonsils: No tonsillar exudate or tonsillar abscesses.     Comments: Poor dentician  R lower molar is broken with obvious cavity. Surrounding gingival swelling and tenderness. Some corresponding facial swelling. No trismus, drooling, sore throat, voice changes, swelling underneath the tongue, swelling underneath the jaw, neck stiffness.  Eyes:     Extraocular Movements: Extraocular movements intact.     Pupils: Pupils are equal, round, and reactive to light.  Pulmonary:     Effort: Pulmonary effort is normal.  Neurological:     General: No focal deficit present.     Mental Status: She is alert and oriented to person, place, and time.  Psychiatric:        Mood and Affect: Mood normal.        Behavior: Behavior normal.        Thought Content: Thought content normal.        Judgment: Judgment normal.     UC Treatments / Results  Labs (all labs ordered are listed, but only abnormal results are displayed) Labs Reviewed - No data to display  EKG   Radiology No results found.  Procedures Procedures (including critical care time)  Medications Ordered in UC Medications - No data to display  Initial Impression / Assessment and Plan / UC Course  I have reviewed the triage vital signs and the nursing notes.  Pertinent labs & imaging results that were available during my care of the patient were reviewed by me and considered in my medical decision making (see chart for details).     This patient is a very pleasant 26 y.o. year old female presenting with dental infection. Afebrile, nontachy. Unsure if pregnant; she prefers to take home pregnancy test, so will treat with amoxicillin today.  Tylenol for pain until negative home pregnancy. She does not have a dentist, dental resource guide provided. ED return precautions discussed. Patient verbalizes understanding and agreement.     Final Clinical Impressions(s) / UC Diagnoses   Final diagnoses:  Dental infection  Discharge Instructions      -Amoxicillin twice daily x7 days, you can take with food -Tylenol for pain     ED Prescriptions     Medication Sig Dispense Auth. Provider   amoxicillin (AMOXIL) 875 MG tablet Take 1 tablet (875 mg total) by mouth 2 (two) times daily for 7 days. 14 tablet Rhys MartiniGraham, Kyla Duffy E, PA-C      PDMP not reviewed this encounter.   Rhys MartiniGraham, Laron Angelini E, PA-C 06/10/21 1144

## 2021-06-10 NOTE — Discharge Instructions (Signed)
-  Amoxicillin twice daily x7 days, you can take with food -Tylenol for pain

## 2021-09-27 ENCOUNTER — Encounter (HOSPITAL_COMMUNITY): Payer: Self-pay | Admitting: Emergency Medicine

## 2021-09-27 ENCOUNTER — Other Ambulatory Visit: Payer: Self-pay

## 2021-09-27 ENCOUNTER — Ambulatory Visit (HOSPITAL_COMMUNITY)
Admission: EM | Admit: 2021-09-27 | Discharge: 2021-09-27 | Disposition: A | Discharge status: Home / Self Care | Payer: Self-pay | Attending: Emergency Medicine | Admitting: Emergency Medicine

## 2021-09-27 ENCOUNTER — Ambulatory Visit (INDEPENDENT_AMBULATORY_CARE_PROVIDER_SITE_OTHER): Payer: Self-pay

## 2021-09-27 DIAGNOSIS — J189 Pneumonia, unspecified organism: Secondary | ICD-10-CM

## 2021-09-27 DIAGNOSIS — R0782 Intercostal pain: Secondary | ICD-10-CM

## 2021-09-27 MED ORDER — AMOXICILLIN-POT CLAVULANATE 875-125 MG PO TABS: 1.0000 | ORAL_TABLET | Freq: Two times a day (BID) | ORAL | 0 refills | Status: AC

## 2021-09-27 NOTE — Discharge Instructions (Addendum)
Your chest x-ray showed pneumonia ? ?Begin use of Augmentin twice daily for the next 10 days, begin to see improvement with symptoms after 48 hours of medication use and continued improvement from there ? ?You may use Tylenol or ibuprofen for management of your discomfort ? ?May help warm compresses to the affected area for general comfort ? ?May wear an abdominal binder or hold a pillow to your chest to provide patient all support ? ?At any point if your symptoms worsen you will need to go to the nearest emergency department for further evaluation ?

## 2021-09-27 NOTE — ED Triage Notes (Signed)
Symptoms started 2 days ago.  Left side rib cage pain.  Patient reports when trying to lie down, has extreme pain in axilla/left ribs.   ?

## 2021-09-27 NOTE — ED Provider Notes (Signed)
?[facility] ? ? ? ?CSN: [id number] ?Arrival date & time: 09/27/21  1212 ? ? ?  ? ?History   ?Chief Complaint ?Chief Complaint  ?Patient presents with  ? Shortness of Breath  ? ? ?HPI ?Emily Logan is a 26 y.o. female.  ? ?She presents with intermittent shortness of breath with exertion, chest tightness and rhinorrhea for 2 days.  Endorses that symptoms worsened today.  Pain can be felt with coughing, deep breathing and movement.  Pain primarily at the left chest wall and flank.  Denies fever, chills, body aches, cough, wheezing, sore throat, ear pain or headaches.  Endorses that her twin was diagnosed with pneumonia over the weekend.  Not attempted treatment of symptoms. ? ?History reviewed. No pertinent past medical history. ? ?Patient Active Problem List  ? Diagnosis Date Noted  ? Suicidal ideation 07/16/2020  ? Polysubstance abuse (HCC) 07/16/2020  ? MDD (major depressive disorder) 07/16/2020  ? Substance induced mood disorder (HCC) 07/15/2020  ? ? ?History reviewed. No pertinent surgical history. ? ?OB History   ?No obstetric history on file. ?  ? ? ? ?Home Medications   ? ?Prior to Admission medications   ?Medication Sig Start Date End Date Taking? Authorizing Provider  ?hydrOXYzine (ATARAX/VISTARIL) 25 MG tablet Take 1 tablet (25 mg total) by mouth 3 (three) times daily as needed for anxiety. ?Patient not taking: Reported on 09/27/2021 07/17/20   Vanetta Mulders, NP  ?methadone (DOLOPHINE) 10 MG/5ML solution Take by mouth every 6 (six) hours as needed for pain. ?Patient not taking: Reported on 09/27/2021    [provider]  ?naproxen (NAPROSYN) 500 MG tablet Take 1 tablet (500 mg total) by mouth 2 (two) times daily with a meal. ?Patient not taking: Reported on 09/27/2021 11/08/19   Wallis Bamberg, PA-C  ?traZODone (DESYREL) 50 MG tablet Take 1 tablet (50 mg total) by mouth at bedtime as needed for sleep. ?Patient not taking: Reported on 09/27/2021 07/17/20   Vanetta Mulders, NP   ? ? ?Family History ?History reviewed. No pertinent family history. ? ?Social History ?Social History  ? ?Tobacco Use  ? Smoking status: Every Day  ?  Packs/day: 1.00  ?  Types: Cigarettes  ? Smokeless tobacco: Never  ?Vaping Use  ? Vaping Use: Former  ?Substance Use Topics  ? Alcohol use: Never  ? Drug use: Not Currently  ?  Types: Heroin, "Crack" cocaine, Methamphetamines, IV, Cocaine  ?  Comment: heroin  ? ? ? ?Allergies   ?Patient has no known allergies. ? ? ?Review of Systems ?Review of Systems  ?Constitutional: Negative.   ?HENT:  Positive for rhinorrhea. Negative for congestion, dental problem, drooling, ear discharge, ear pain, facial swelling, hearing loss, mouth sores, nosebleeds, postnasal drip, sinus pressure, sinus pain, sneezing, sore throat, tinnitus, trouble swallowing and voice change.   ?Eyes: Negative.   ?Respiratory:  Positive for shortness of breath. Negative for apnea, cough, choking, chest tightness, wheezing and stridor.   ?Cardiovascular: Negative.   ?Gastrointestinal: Negative.   ?Skin: Negative.   ?Neurological: Negative.   ? ? ?Physical Exam ?Triage Vital Signs ?ED Triage Vitals  ?Enc Vitals Group  ?   BP 09/27/21 1257 107/67  ?   Pulse Rate 09/27/21 1257 (!) 104  ?   Resp 09/27/21 1257 18  ?   Temp 09/27/21 1257 98.9 ?F (37.2 ?C)  ?   Temp Source 09/27/21 1257 Oral  ?   SpO2 09/27/21 1257 96 %  ?  Weight --   ?   Height --   ?   Head Circumference --   ?   Peak Flow --   ?   Pain Score 09/27/21 1255 9  ?   Pain Loc --   ?   Pain Edu? --   ?   Excl. in GC? --   ? ?No data found. ? ?Updated Vital Signs ?BP 107/67 (BP Location: Left Arm)   Pulse (!) 104   Temp 98.9 ?F (37.2 ?C) (Oral)   Resp 18   LMP 07/19/2021   SpO2 96%  ? ?Visual Acuity ?Right Eye Distance:   ?Left Eye Distance:   ?Bilateral Distance:   ? ?Right Eye Near:   ?Left Eye Near:    ?Bilateral Near:    ? ?Physical Exam ?Constitutional:   ?   Appearance: Normal appearance.  ?HENT:  ?   Head: Normocephalic.  ?   Right Ear:  Tympanic membrane, ear canal and external ear normal.  ?   Left Ear: Tympanic membrane, ear canal and external ear normal.  ?   Nose: Rhinorrhea present. No congestion.  ?   Mouth/Throat:  ?   Mouth: Mucous membranes are moist.  ?   Pharynx: Oropharynx is clear.  ?Eyes:  ?   Extraocular Movements: Extraocular movements intact.  ?Cardiovascular:  ?   Rate and Rhythm: Normal rate and regular rhythm.  ?   Pulses: Normal pulses.  ?   Heart sounds: Normal heart sounds.  ?Pulmonary:  ?   Effort: Pulmonary effort is normal.  ?   Breath sounds: Normal breath sounds.  ?Skin: ?   General: Skin is warm and dry.  ?Neurological:  ?   Mental Status: She is alert and oriented to person, place, and time. Mental status is at baseline.  ?Psychiatric:     ?   Mood and Affect: Mood normal.     ?   Behavior: Behavior normal.  ? ? ? ?UC Treatments / Results  ?Labs ?(all labs ordered are listed, but only abnormal results are displayed) ?Labs Reviewed - No data to display ? ?EKG ? ? ?Radiology ?No results found. ? ?Procedures ?Procedures (including critical care time) ? ?Medications Ordered in UC ?Medications - No data to display ? ?Initial Impression / Assessment and Plan / UC Course  ?I have reviewed the triage vital signs and the nursing notes. ? ?Pertinent labs & imaging results that were available during my care of the patient were reviewed by me and considered in my medical decision making (see chart for details). ? ?Community-acquired pneumonia ? ?Confirm by x-ray, vital signs are stable, O2 saturation 96% on room air, stable for outpatient treatment, Augmentin 10-day course prescribed, recommended Tylenol, ibuprofen and warm compresses to the affected area for additional comfort, may use a abdominal binder or pillow press to the chest to add support with movement, given strict precautions for any worsening shortness of breath to go to the nearest emergency department for evaluation, work note given ?Final Clinical Impressions(s) /  UC Diagnoses  ? ?Final diagnoses:  ?None  ? ?Discharge Instructions   ?None ?  ? ?ED Prescriptions   ?None ?  ? ?PDMP not reviewed this encounter. ?  ?Valinda Hoar, NP ?09/27/21 1405 ? ?

## 2022-07-20 ENCOUNTER — Encounter (HOSPITAL_COMMUNITY): Payer: Self-pay

## 2022-07-20 ENCOUNTER — Ambulatory Visit (HOSPITAL_COMMUNITY)
Admission: EM | Admit: 2022-07-20 | Discharge: 2022-07-20 | Disposition: A | Discharge status: Home / Self Care | Payer: Medicaid Other | Attending: Family Medicine | Admitting: Family Medicine

## 2022-07-20 DIAGNOSIS — L03114 Cellulitis of left upper limb: Secondary | ICD-10-CM

## 2022-07-20 DIAGNOSIS — K0889 Other specified disorders of teeth and supporting structures: Secondary | ICD-10-CM

## 2022-07-20 DIAGNOSIS — F1990 Other psychoactive substance use, unspecified, uncomplicated: Secondary | ICD-10-CM | POA: Diagnosis not present

## 2022-07-20 DIAGNOSIS — L03113 Cellulitis of right upper limb: Secondary | ICD-10-CM | POA: Diagnosis not present

## 2022-07-20 LAB — POC URINE PREG, ED: Preg Test, Ur: NEGATIVE

## 2022-07-20 MED ORDER — AMOXICILLIN-POT CLAVULANATE 875-125 MG PO TABS: 1.0000 | ORAL_TABLET | Freq: Two times a day (BID) | ORAL | 0 refills | Status: DC

## 2022-07-20 NOTE — ED Triage Notes (Signed)
Pt c/o right face edema and bilat hand edema that all started yesterday. Questioning more about the cause pt states IVDU may have caused it. Concerned needs abx.   Wants to discuss family planning/birth control options.

## 2022-07-21 NOTE — ED Provider Notes (Addendum)
[facility]   [id number] 07/20/22 Arrival Time: [id number]  ASSESSMENT & PLAN:  1. Cellulitis of right upper extremity   2. Cellulitis of left upper extremity   3. Pain, dental   4. IVDU (intravenous drug user)    No signs of abscess formation of hands/oral cavity. Begin: Meds ordered this encounter  Medications   amoxicillin-clavulanate (AUGMENTIN) 875-125 MG tablet    Sig: Take 1 tablet by mouth every 12 (twelve) hours.    Dispense:  20 tablet    Refill:  0    Follow-up Information     [facility].   Specialty: Emergency Medicine Why: If worsening or failing to improve as anticipated. Contact information: [id number] [place] [phone]               Work note provided. Will follow up with PCP or here if worsening or failing to improve as anticipated. Reviewed expectations re: course of current medical issues. Questions answered. Outlined signs and symptoms indicating need for more acute intervention. Patient verbalized understanding. After Visit Summary given.   SUBJECTIVE:  Emily Logan is a 27 y.o. female who presents with a skin complaint. Pt c/o right face edema/R upper gum pain and bilat hand edema that all started yesterday. 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 Afebrile but feels feverish; with systemic symptoms including fatigue and body aches. Tolerating PO intake. No extremity sensation changes or weakness. No tx PTA.  OBJECTIVE: Vitals:   07/20/22 1757  BP: 113/64  Pulse: 93  Resp: 16  Temp: (!) 97.4 F (36.3 C)  TempSrc: Oral  SpO2: 97%    General appearance: alert; no distress HEENT: [place]; AT; TTP over R upper anterior gum; mild swelling just lateral to R naris; no obvious abscess appreciated Neck: supple with FROM Lungs: clear to auscultation  bilaterally Heart: regular rate and rhythm Extremities: no edema; moves all extremities normally Skin: warm and dry; diffuse redness/warmth over bilateral dorsal hands; multiple healing superficial wounds; no areas of fluctuance Psychological: alert and cooperative; normal mood and affect  No Known Allergies  History reviewed. No pertinent past medical history. Social History   Socioeconomic History   Marital status: Single    Spouse name: Not on file   Number of children: Not on file   Years of education: Not on file   Highest education level: Not on file  Occupational History   Not on file  Tobacco Use   Smoking status: Every Day    Packs/day: 1.00    Types: Cigarettes   Smokeless tobacco: Never  Vaping Use   Vaping Use: Former  Substance and Sexual Activity   Alcohol use: Never   Drug use: Not Currently    Types: Heroin, "Crack" cocaine, Methamphetamines, IV, Cocaine    Comment: heroin   Sexual activity: Yes    Birth control/protection: None  Other Topics Concern   Not on file  Social History Narrative   ** Merged History Encounter **       Social Determinants of Health   Financial Resource Strain: Not on file  Food Insecurity: Not on file  Transportation Needs: Not on file  Physical Activity: Not on file  Stress: Not on file  Social Connections: Not on file  Intimate Partner Violence: Not on file   History reviewed. No pertinent family history. History reviewed. No pertinent surgical history.  Vanessa Kick, MD 07/21/22 1328    Vanessa Kick, MD 07/21/22 1331

## 2022-08-01 ENCOUNTER — Ambulatory Visit (HOSPITAL_COMMUNITY)
Admission: EM | Admit: 2022-08-01 | Discharge: 2022-08-01 | Disposition: A | Discharge status: Home / Self Care | Payer: Medicaid Other

## 2022-08-01 ENCOUNTER — Encounter (HOSPITAL_COMMUNITY): Payer: Self-pay

## 2022-08-01 DIAGNOSIS — R112 Nausea with vomiting, unspecified: Secondary | ICD-10-CM

## 2022-08-01 DIAGNOSIS — R197 Diarrhea, unspecified: Secondary | ICD-10-CM

## 2022-08-01 NOTE — ED Provider Notes (Signed)
[facility]    CSN: [id number] Arrival date & time: 08/01/22  1753      History   Chief Complaint Chief Complaint  Patient presents with   Emesis   Diarrhea    HPI Emily Logan is a 27 y.o. female.   Patient reports 2 days ago she began experiencing nausea vomiting diarrhea.  She reports symptoms have resolved at this time.  She reports she missed work yesterday and and did not go to work today.  She has returned to baseline, eating and drinking normally.  She needs a note to return to work tomorrow    History reviewed. No pertinent past medical history.  Patient Active Problem List   Diagnosis Date Noted   Suicidal ideation 07/16/2020   Polysubstance abuse ([place]) 07/16/2020   MDD (major depressive disorder) 07/16/2020   Substance induced mood disorder ([place]) 07/15/2020    History reviewed. No pertinent surgical history.  OB History   No obstetric history on file.      Home Medications    Prior to Admission medications   Not on File    Family History History reviewed. No pertinent family history.  Social History Social History   Tobacco Use   Smoking status: Every Day    Packs/day: 1.00    Types: Cigarettes   Smokeless tobacco: Never  Vaping Use   Vaping Use: Former  Substance Use Topics   Alcohol use: Never   Drug use: Not Currently    Types: Heroin, "Crack" cocaine, Methamphetamines, IV, Cocaine    Comment: heroin     Allergies   Patient has no known allergies.   Review of Systems Review of Systems  Constitutional:  Negative for chills and fever.  HENT:  Negative for ear pain and sore throat.   Eyes:  Negative for pain and visual disturbance.  Respiratory:  Negative for cough and shortness of breath.   Cardiovascular:  Negative for chest pain and palpitations.  Gastrointestinal:  Negative for abdominal pain and vomiting.  Genitourinary:  Negative for dysuria and hematuria.  Musculoskeletal:  Negative for arthralgias  and back pain.  Skin:  Negative for color change and rash.  Neurological:  Negative for seizures and syncope.  All other systems reviewed and are negative.    Physical Exam Triage Vital Signs ED Triage Vitals [08/01/22 1948]  Enc Vitals Group     BP (!) 107/59     Pulse Rate 80     Resp 18     Temp 98.8 F (37.1 C)     Temp Source Oral     SpO2 98 %     Weight      Height      Head Circumference      Peak Flow      Pain Score 0     Pain Loc      Pain Edu?      Excl. in [place]?    No data found.  Updated Vital Signs BP (!) 107/59 (BP Location: Left Arm)   Pulse 80   Temp 98.8 F (37.1 C) (Oral)   Resp 18   SpO2 98%   Visual Acuity Right Eye Distance:   Left Eye Distance:   Bilateral Distance:    Right Eye Near:   Left Eye Near:    Bilateral Near:     Physical Exam Vitals and nursing note reviewed.  Constitutional:      General: She is not in acute distress.  Appearance: She is well-developed.  HENT:     Head: Normocephalic and atraumatic.  Eyes:     Conjunctiva/sclera: Conjunctivae normal.  Cardiovascular:     Rate and Rhythm: Normal rate and regular rhythm.     Heart sounds: No murmur heard. Pulmonary:     Effort: Pulmonary effort is normal. No respiratory distress.     Breath sounds: Normal breath sounds.  Abdominal:     Palpations: Abdomen is soft.     Tenderness: There is no abdominal tenderness.  Musculoskeletal:        General: No swelling.     Cervical back: Neck supple.  Skin:    General: Skin is warm and dry.     Capillary Refill: Capillary refill takes less than 2 seconds.  Neurological:     Mental Status: She is alert.  Psychiatric:        Mood and Affect: Mood normal.      UC Treatments / Results  Labs (all labs ordered are listed, but only abnormal results are displayed) Labs Reviewed - No data to display  EKG   Radiology No results found.  Procedures Procedures (including critical care time)  Medications Ordered in  UC Medications - No data to display  Initial Impression / Assessment and Plan / UC Course  I have reviewed the triage vital signs and the nursing notes.  Pertinent labs & imaging results that were available during my care of the patient were reviewed by me and considered in my medical decision making (see chart for details).     Requesting note to return to work.  Symptoms have resolved.  No fever.  Can return tomorrow. Final Clinical Impressions(s) / UC Diagnoses   Final diagnoses:  Nausea vomiting and diarrhea     Discharge Instructions      May return to work as long as fever free for 24 hours.     ED Prescriptions   None    PDMP not reviewed this encounter.   Ward, Lenise Arena, PA-C 08/01/22 2010

## 2022-08-01 NOTE — ED Triage Notes (Signed)
Pt c/o N/V/D Saturday and Sunday and unable to work. States feels much better today, no n/v/d and eating and drinking. States needs a work note for yesterday.

## 2022-08-01 NOTE — Discharge Instructions (Signed)
May return to work as long as fever free for 24 hours.

## 2022-08-15 ENCOUNTER — Emergency Department (HOSPITAL_COMMUNITY)
Admission: EM | Admit: 2022-08-15 | Discharge: 2022-08-15 | Discharge status: Against Medical Advice | Payer: Medicaid Other | Attending: Emergency Medicine | Admitting: Emergency Medicine

## 2022-08-15 ENCOUNTER — Encounter (HOSPITAL_COMMUNITY): Payer: Self-pay

## 2022-08-15 ENCOUNTER — Other Ambulatory Visit: Payer: Self-pay

## 2022-08-15 ENCOUNTER — Ambulatory Visit (HOSPITAL_COMMUNITY)
Admission: EM | Admit: 2022-08-15 | Discharge: 2022-08-15 | Disposition: A | Discharge status: Home / Self Care | Payer: Medicaid Other | Attending: Internal Medicine | Admitting: Internal Medicine

## 2022-08-15 DIAGNOSIS — F1910 Other psychoactive substance abuse, uncomplicated: Secondary | ICD-10-CM | POA: Diagnosis not present

## 2022-08-15 DIAGNOSIS — Z5321 Procedure and treatment not carried out due to patient leaving prior to being seen by health care provider: Secondary | ICD-10-CM | POA: Diagnosis not present

## 2022-08-15 DIAGNOSIS — F1110 Opioid abuse, uncomplicated: Secondary | ICD-10-CM

## 2022-08-15 DIAGNOSIS — R42 Dizziness and giddiness: Secondary | ICD-10-CM | POA: Diagnosis present

## 2022-08-15 HISTORY — DX: Opioid abuse, uncomplicated: F11.10

## 2022-08-15 LAB — CBC WITH DIFFERENTIAL/PLATELET
Abs Immature Granulocytes: 0.02 10*3/uL (ref 0.00–0.07)
Basophils Absolute: 0.1 10*3/uL (ref 0.0–0.1)
Basophils Relative: 1 %
Eosinophils Absolute: 0.7 10*3/uL — ABNORMAL HIGH (ref 0.0–0.5)
Eosinophils Relative: 7 %
HCT: 51.2 % — ABNORMAL HIGH (ref 36.0–46.0)
Hemoglobin: 16.2 g/dL — ABNORMAL HIGH (ref 12.0–15.0)
Immature Granulocytes: 0 %
Lymphocytes Relative: 28 %
Lymphs Abs: 2.6 10*3/uL (ref 0.7–4.0)
MCH: 26.2 pg (ref 26.0–34.0)
MCHC: 31.6 g/dL (ref 30.0–36.0)
MCV: 82.8 fL (ref 80.0–100.0)
Monocytes Absolute: 0.6 10*3/uL (ref 0.1–1.0)
Monocytes Relative: 7 %
Neutro Abs: 5.3 10*3/uL (ref 1.7–7.7)
Neutrophils Relative %: 57 %
Platelets: 434 10*3/uL — ABNORMAL HIGH (ref 150–400)
RBC: 6.18 MIL/uL — ABNORMAL HIGH (ref 3.87–5.11)
RDW: 14.1 % (ref 11.5–15.5)
WBC: 9.2 10*3/uL (ref 4.0–10.5)
nRBC: 0 % (ref 0.0–0.2)

## 2022-08-15 LAB — I-STAT BETA HCG BLOOD, ED (MC, WL, AP ONLY): I-stat hCG, quantitative: <5 m[IU]/mL (ref <5)

## 2022-08-15 LAB — LACTIC ACID, PLASMA: Lactic Acid, Venous: 2.9 mmol/L (ref 0.5–1.9)

## 2022-08-15 NOTE — Discharge Instructions (Addendum)
Patient is advised to go to the emergency room for further evaluation.  Patient is drowsy, tachycardic with a soft blood pressure and may require some IV fluids.  I have some concern for withdrawal.  Giving your recent weight loss and chills, I am concerned that you may have heart valve disease.

## 2022-08-15 NOTE — ED Provider Notes (Signed)
[place]    CSN: [id number] Arrival date & time: 08/15/22  1537      History   Chief Complaint Chief Complaint  Patient presents with   Anorexia   Chest Pain    HPI Emily Logan is a 27 y.o. female comes to the urgent care with complaints of chest pain, cough, loss of appetite, generalized fatigue and recent weight loss.  Patient has a history of IV drug use.  Patient's drug of choice is heroin.  Patient endorses heroin use over the past couple of days and had only a worse this morning.  She complains of cramping in both lower extremities and some abdominal cramping as well.  No nausea or vomiting.  She remains drowsy in the urgent care.  She is able to communicate clearly and engage mentally when having a conversation.  Patient complains of recent weight loss, chills and fatigue over the past several days.  She has sharp chest pain which is mainly on the right side of the chest.  No cough or sputum production.  No hemoptysis.  Patient denies a history of infective endocarditis.  No changes in bowel movement.  HPI  Past Medical History:  Diagnosis Date   Heroin abuse [facility])     Patient Active Problem List   Diagnosis Date Noted   Suicidal ideation 07/16/2020   Polysubstance abuse ([place]) 07/16/2020   MDD (major depressive disorder) 07/16/2020   Substance induced mood disorder ([place]) 07/15/2020    Past Surgical History:  Procedure Laterality Date   arm surgery      OB History   No obstetric history on file.      Home Medications    Prior to Admission medications   Not on File    Family History History reviewed. No pertinent family history.  Social History Social History   Tobacco Use   Smoking status: Every Day    Packs/day: 1.00    Types: Cigarettes   Smokeless tobacco: Never  Vaping Use   Vaping Use: Former  Substance Use Topics   Alcohol use: Never   Drug use: Yes    Types: Heroin, IV, Methamphetamines    Comment: heroin      Allergies   Patient has no known allergies.   Review of Systems Review of Systems As per HPI  Physical Exam Triage Vital Signs ED Triage Vitals  Enc Vitals Group     BP 08/15/22 1620 92/66     Pulse Rate 08/15/22 1620 (!) 105     Resp 08/15/22 1620 18     Temp 08/15/22 1620 97.9 F (36.6 C)     Temp Source 08/15/22 1620 Oral     SpO2 08/15/22 1620 95 %     Weight --      Height --      Head Circumference --      Peak Flow --      Pain Score 08/15/22 1622 3     Pain Loc --      Pain Edu? --      Excl. in [place]? --    No data found.  Updated Vital Signs BP 92/66 (BP Location: Left Arm)   Pulse (!) 105   Temp 97.9 F (36.6 C) (Oral)   Resp 18   LMP 08/15/2022   SpO2 95%   Visual Acuity Right Eye Distance:   Left Eye Distance:   Bilateral Distance:    Right Eye Near:   Left Eye Near:  Bilateral Near:     Physical Exam Vitals and nursing note reviewed.  Constitutional:      General: She is not in acute distress.    Appearance: She is ill-appearing.  Cardiovascular:     Rate and Rhythm: Normal rate and regular rhythm.     Heart sounds: Heart sounds are distant.  Pulmonary:     Effort: Pulmonary effort is normal. No tachypnea or respiratory distress.     Breath sounds: No decreased breath sounds.  Abdominal:     Palpations: Abdomen is soft.  Musculoskeletal:     Cervical back: Normal range of motion and neck supple.  Neurological:     Mental Status: She is alert.      UC Treatments / Results  Labs (all labs ordered are listed, but only abnormal results are displayed) Labs Reviewed - No data to display  EKG   Radiology No results found.  Procedures Procedures (including critical care time)  Medications Ordered in UC Medications - No data to display  Initial Impression / Assessment and Plan / UC Course  I have reviewed the triage vital signs and the nursing notes.  Pertinent labs & imaging results that were available during my  care of the patient were reviewed by me and considered in my medical decision making (see chart for details).     1.  IV drug use (heroin): I am concerned that the patient may be experiencing mild withdrawal symptoms.  Patient's respiratory status is stable.  She is not cyanotic.  He is drowsy but is able to hold a conversation when engaged.  2.  Recent weight loss fatigue and anorexia: Concern for subacute infective endocarditis. Patient is advised to go to the emergency department for further evaluation. Final Clinical Impressions(s) / UC Diagnoses   Final diagnoses:  Heroin abuse [facility])     Discharge Instructions      Patient is advised to go to the emergency room for further evaluation.  Patient is drowsy, tachycardic with a soft blood pressure and may require some IV fluids.  I have some concern for withdrawal.  Giving your recent weight loss and chills, I am concerned that you may have heart valve disease.   ED Prescriptions   None    PDMP not reviewed this encounter.   Chase Picket, MD 08/15/22 [phone]

## 2022-08-15 NOTE — ED Notes (Signed)
The patient's name was called for a lab recollect for her CMP but she did not answer and was not seen in the ER. Will try again shortly.

## 2022-08-15 NOTE — ED Notes (Signed)
Lactic acid 2.9. Suella Broad, PA informed via face to face conversation. No new verbal orders received.

## 2022-08-15 NOTE — ED Notes (Signed)
Other patients in the lobby who was sitting next to this patient report they saw this patient leave.

## 2022-08-15 NOTE — ED Triage Notes (Signed)
Pt reports she is an IV drug user, last used Heroin yesterday but she states she no longer feels high from the use. She states she feels "weird" and dizzy and reports her face looks skinnier than normal.

## 2022-08-15 NOTE — ED Provider Triage Note (Signed)
Emergency Medicine Provider Triage Evaluation Note  Emily Logan , a 27 y.o. female  was evaluated in triage.  27 year old female with history I have IV drug abuse, heroin, presents with concern for her health.  Patient states that her mom got sick at one point and had endocarditis.  Patient cannot pinpoint what does not feel quite right but feels like something might be wrong.  She states that she is feeling better currently than when she first woke up this morning.  She denies nausea, vomiting, fevers, chills, diarrhea, changes in bladder habits or recent illness.  Denies concerns for skin infections.   Review of Systems  Positive:  Negative:   Physical Exam  BP 106/66   Pulse (!) 112   Temp (!) 97.5 F (36.4 C) (Oral)   Resp 16   Ht '5\' 4"'$  (1.626 m)   Wt 45.4 kg   LMP 08/15/2022   SpO2 100%   BMI 17.16 kg/m  Gen:   Awake, no distress   Resp:  Normal effort  MSK:   Moves extremities without difficulty  Other:    Medical Decision Making  Medically screening exam initiated at 5:08 PM.  Appropriate orders placed.  Emily Logan was informed that the remainder of the evaluation will be completed by another provider, this initial triage assessment does not replace that evaluation, and the importance of remaining in the ED until their evaluation is complete.  Eating a snack, feeling better at this time.    Tacy Learn, PA-C 08/15/22 1708

## 2022-08-15 NOTE — ED Notes (Signed)
Dr. Lanny Cramp instructed the patient to go to the ED for further treatment.

## 2022-08-15 NOTE — ED Notes (Signed)
Patient is being discharged from the Urgent Care and sent to the Emergency Department via POv with mother. Per Dr Lanny Cramp, patient is in need of higher level of care due to possible heroin withdraw. Patient is aware and verbalizes understanding of plan of care.  Vitals:   08/15/22 1620  BP: 92/66  Pulse: (!) 105  Resp: 18  Temp: 97.9 F (36.6 C)  SpO2: 95%

## 2022-08-15 NOTE — ED Triage Notes (Addendum)
Patient looks extremely drowsy upon arrival to the room.  Patient states that she is an IV drug abuser/heroin. Patient states she used 2 days straight, but today she only had "wash" Patient states that is leftover heroin in a syringe.  Patient also c/o abdominal cramping.  Patient reports that she has been having chest pain and states when she shot up the past 2 days she could not even tell that she had injected heroin.

## 2022-09-07 ENCOUNTER — Ambulatory Visit: Payer: Medicaid Other | Admitting: Nurse Practitioner

## 2022-09-20 ENCOUNTER — Ambulatory Visit: Payer: Medicaid Other | Admitting: Nurse Practitioner

## 2022-10-04 ENCOUNTER — Ambulatory Visit (HOSPITAL_COMMUNITY)
Admission: EM | Admit: 2022-10-04 | Discharge: 2022-10-04 | Disposition: A | Discharge status: Home / Self Care | Payer: Medicaid Other

## 2022-10-04 ENCOUNTER — Other Ambulatory Visit: Payer: Self-pay

## 2022-10-04 ENCOUNTER — Encounter (HOSPITAL_COMMUNITY): Payer: Self-pay

## 2022-10-04 ENCOUNTER — Emergency Department (HOSPITAL_COMMUNITY)
Admission: EM | Admit: 2022-10-04 | Discharge: 2022-10-04 | Discharge status: Against Medical Advice | Payer: 59 | Attending: Emergency Medicine | Admitting: Emergency Medicine

## 2022-10-04 DIAGNOSIS — D649 Anemia, unspecified: Secondary | ICD-10-CM | POA: Diagnosis not present

## 2022-10-04 DIAGNOSIS — L0291 Cutaneous abscess, unspecified: Secondary | ICD-10-CM

## 2022-10-04 DIAGNOSIS — Z5329 Procedure and treatment not carried out because of patient's decision for other reasons: Secondary | ICD-10-CM | POA: Diagnosis not present

## 2022-10-04 DIAGNOSIS — R509 Fever, unspecified: Secondary | ICD-10-CM

## 2022-10-04 DIAGNOSIS — L02414 Cutaneous abscess of left upper limb: Secondary | ICD-10-CM | POA: Insufficient documentation

## 2022-10-04 DIAGNOSIS — F1990 Other psychoactive substance use, unspecified, uncomplicated: Secondary | ICD-10-CM

## 2022-10-04 DIAGNOSIS — R000 Tachycardia, unspecified: Secondary | ICD-10-CM | POA: Diagnosis not present

## 2022-10-04 LAB — CBC WITH DIFFERENTIAL/PLATELET
Abs Immature Granulocytes: 0.03 10*3/uL (ref 0.00–0.07)
Basophils Absolute: 0.1 10*3/uL (ref 0.0–0.1)
Basophils Relative: 1 %
Eosinophils Absolute: 0.6 10*3/uL — ABNORMAL HIGH (ref 0.0–0.5)
Eosinophils Relative: 6 %
HCT: 35.5 % — ABNORMAL LOW (ref 36.0–46.0)
Hemoglobin: 10.8 g/dL — ABNORMAL LOW (ref 12.0–15.0)
Immature Granulocytes: 0 %
Lymphocytes Relative: 24 %
Lymphs Abs: 2.3 10*3/uL (ref 0.7–4.0)
MCH: 24.8 pg — ABNORMAL LOW (ref 26.0–34.0)
MCHC: 30.4 g/dL (ref 30.0–36.0)
MCV: 81.6 fL (ref 80.0–100.0)
Monocytes Absolute: 0.6 10*3/uL (ref 0.1–1.0)
Monocytes Relative: 6 %
Neutro Abs: 6.1 10*3/uL (ref 1.7–7.7)
Neutrophils Relative %: 63 %
Platelets: 294 10*3/uL (ref 150–400)
RBC: 4.35 MIL/uL (ref 3.87–5.11)
RDW: 14.8 % (ref 11.5–15.5)
WBC: 9.6 10*3/uL (ref 4.0–10.5)
nRBC: 0 % (ref 0.0–0.2)

## 2022-10-04 LAB — COMPREHENSIVE METABOLIC PANEL
ALT: 10 U/L (ref 0–44)
AST: 12 U/L — ABNORMAL LOW (ref 15–41)
Albumin: 3.4 g/dL — ABNORMAL LOW (ref 3.5–5.0)
Alkaline Phosphatase: 74 U/L (ref 38–126)
Anion gap: 8 (ref 5–15)
BUN: 10 mg/dL (ref 6–20)
CO2: 24 mmol/L (ref 22–32)
Calcium: 8.8 mg/dL — ABNORMAL LOW (ref 8.9–10.3)
Chloride: 105 mmol/L (ref 98–111)
Creatinine, Ser: 0.74 mg/dL (ref 0.44–1.00)
GFR, Estimated: >60 mL/min (ref >60)
Glucose, Bld: 121 mg/dL — ABNORMAL HIGH (ref 70–99)
Potassium: 4.1 mmol/L (ref 3.5–5.1)
Sodium: 137 mmol/L (ref 135–145)
Total Bilirubin: 0.5 mg/dL (ref 0.3–1.2)
Total Protein: 8.2 g/dL — ABNORMAL HIGH (ref 6.5–8.1)

## 2022-10-04 LAB — LACTIC ACID, PLASMA: Lactic Acid, Venous: 1.1 mmol/L (ref 0.5–1.9)

## 2022-10-04 MED ORDER — DOXYCYCLINE HYCLATE 100 MG PO TABS: 100.0000 mg | ORAL_TABLET | Freq: Once | ORAL | Status: DC

## 2022-10-04 MED ORDER — DOXYCYCLINE HYCLATE 100 MG PO CAPS: 100.0000 mg | ORAL_CAPSULE | Freq: Two times a day (BID) | ORAL | 0 refills | Status: DC

## 2022-10-04 NOTE — ED Triage Notes (Signed)
Patient is in with abscesses to both upper arms. Patient states she last used heroin yesterday.

## 2022-10-04 NOTE — ED Provider Notes (Signed)
[facility]    CSN: [id number] Arrival date & time: 10/04/22  1126      History   Chief Complaint No chief complaint on file.   HPI Emily Logan is a 27 y.o. female.   Patient presents today with a several day history of worsening abscesses on both arms.  She reports that the wound on her left arm is particularly bothersome.  She does have a history of abscesses related to IV drug use but denies previous diagnosis of MRSA.  She reports having difficulty finding her veins and sometimes will inject directly into the muscle or other tissue.  She does report she has been having some fevers and chills.  She reports her last antibiotic use with Augmentin a few months ago.    Past Medical History:  Diagnosis Date   Heroin abuse [facility])     Patient Active Problem List   Diagnosis Date Noted   Suicidal ideation 07/16/2020   Polysubstance abuse ([facility]) 07/16/2020   MDD (major depressive disorder) 07/16/2020   Substance induced mood disorder ([facility]) 07/15/2020    Past Surgical History:  Procedure Laterality Date   arm surgery      OB History   No obstetric history on file.      Home Medications    Prior to Admission medications   Not on File    Family History History reviewed. No pertinent family history.  Social History Social History   Tobacco Use   Smoking status: Every Day    Packs/day: 1    Types: Cigarettes   Smokeless tobacco: Never  Vaping Use   Vaping Use: Former  Substance Use Topics   Alcohol use: Never   Drug use: Yes    Types: Heroin, IV    Comment: heroin     Allergies   Patient has no known allergies.   Review of Systems Review of Systems  Constitutional:  Positive for activity change and fever. Negative for appetite change and fatigue.  Musculoskeletal:  Positive for myalgias. Negative for arthralgias.  Skin:  Positive for color change and wound.     Physical Exam Triage Vital Signs ED Triage Vitals  Enc Vitals  Group     BP 10/04/22 1243 120/78     Pulse Rate 10/04/22 1243 (!) 109     Resp 10/04/22 1243 16     Temp 10/04/22 1243 99.1 F (37.3 C)     Temp Source 10/04/22 1243 Oral     SpO2 10/04/22 1243 93 %     Weight --      Height --      Head Circumference --      Peak Flow --      Pain Score 10/04/22 1246 8     Pain Loc --      Pain Edu? --      Excl. in [place]? --    No data found.  Updated Vital Signs BP 120/78 (BP Location: Right Arm)   Pulse (!) 109   Temp 99.1 F (37.3 C) (Oral)   Resp 16   SpO2 93%   Visual Acuity Right Eye Distance:   Left Eye Distance:   Bilateral Distance:    Right Eye Near:   Left Eye Near:    Bilateral Near:     Physical Exam Vitals reviewed.  Constitutional:      General: She is awake. She is not in acute distress.    Appearance: Normal appearance. She is well-developed and underweight.  She is not ill-appearing.     Comments: Very pleasant female appears stated age no acute distress sitting comfortably in exam room  HENT:     Head: Normocephalic and atraumatic.  Cardiovascular:     Rate and Rhythm: Normal rate and regular rhythm.     Heart sounds: Normal heart sounds, S1 normal and S2 normal. No murmur heard. Pulmonary:     Effort: Pulmonary effort is normal.     Breath sounds: Normal breath sounds. No wheezing, rhonchi or rales.     Comments: Clear to auscultation bilaterally Skin:    Findings: Abscess present.     Comments: Multiple abscesses noted bilateral arms.  Psychiatric:        Behavior: Behavior is cooperative.         UC Treatments / Results  Labs (all labs ordered are listed, but only abnormal results are displayed) Labs Reviewed - No data to display  EKG   Radiology No results found.  Procedures Procedures (including critical care time)  Medications Ordered in UC Medications - No data to display  Initial Impression / Assessment and Plan / UC Course  I have reviewed the triage vital signs and the  nursing notes.  Pertinent labs & imaging results that were available during my care of the patient were reviewed by me and considered in my medical decision making (see chart for details).     Patient is afebrile but tachycardic in clinic.  She does report fever at home.  We discussed that she is at risk for sepsis given her IV drug use and multiple abscesses in the setting of tachycardia and fever.  I recommended she go to the emergency room for further evaluation and management.  Patient was initially hesitant and requested that we attempt to at least drain the abscess that is on her left arm.  After further discussion she ultimately did decide to go to the emergency room for further evaluation and management including potentially IV antibiotics.  Patient is accompanied by a friend who will take her directly to [facility] emergency room for further evaluation and management.  She was stable at the time of discharge.  Final Clinical Impressions(s) / UC Diagnoses   Final diagnoses:  Abscess of multiple sites  Tachycardia  Fever, unspecified  IVDU (intravenous drug user)   Discharge Instructions   None    ED Prescriptions   None    PDMP not reviewed this encounter.   Jeani Hawking, PA-C 10/04/22 1309

## 2022-10-04 NOTE — ED Notes (Signed)
Patient is being discharged from the Urgent Care and sent to the Emergency Department via pov . Per Dorann Ou, PA patient is in need of higher level of care due to abscess/infection. Patient is aware and verbalizes understanding of plan of care.  Vitals:   10/04/22 1243  BP: 120/78  Pulse: (!) 109  Resp: 16  Temp: 99.1 F (37.3 C)  SpO2: 93%

## 2022-10-04 NOTE — ED Triage Notes (Signed)
Abscesses to both upper arms since last week. Pt states she had chills and fever last week, but this has resolved. UC sent pt due to concern for sepsis.

## 2022-10-04 NOTE — Discharge Instructions (Signed)
You are leaving AGAINST MEDICAL ADVICE today you have a very large abscess to your left arm that may be affecting the muscle and an abscess to your right arm as well.  The plan was to get some imaging and likely consult surgery to have drainage and give you IV antibiotics most likely.  Your risk loss of function or permanent damage to your left arm if you do not have this treated.  Understand plan to come back tomorrow first thing, make sure you come back to complete your treatment.  In some cases, severe infection can lead to death.

## 2022-10-04 NOTE — ED Provider Notes (Signed)
[place] EM[facility] AT [facility] Provider Note   CSN: [id number] Arrival date & time: 10/04/22  1321     History  Chief Complaint  Patient presents with   Abscess    Emily Logan is a 27 y.o. female.  She has been injecting heroin about twice a day for the past several years and has history of soft tissue abscesses.  She states she has had abscesses on bilateral upper extremities bicep area for over a week.  The one on the right side drained a large amount of pus yesterday and is somewhat improving but she is having pain and increased swelling to the left bicep area. She denies any documented fever, states she been having some intermittent fevers previously but thinks this was Cotton fever, when she stopped using cotton to strain her heroin the fever stopped she has not had any fever or chills in several days.  Was sent from urgent care for further evaluation and treatment.  Complains of pain in the left arm, not able to fully extend the left elbow.  Denies numbness or tingling.  She does not share needles but does reuse them.  He also reports that due to having poor IV access she sometimes gets frustrated and will inject into the muscle or soft tissue   Abscess      Home Medications Prior to Admission medications   Medication Sig Start Date End Date Taking? Authorizing Provider  doxycycline (VIBRAMYCIN) 100 MG capsule Take 1 capsule (100 mg total) by mouth 2 (two) times daily. 10/04/22  Yes Sheridan Hew A, PA-C      Allergies    Patient has no known allergies.    Review of Systems   Review of Systems  Physical Exam Updated Vital Signs BP 128/88   Pulse 90   Temp 98.5 F (36.9 C) (Oral)   Resp 16   Ht 5\' 4"  (1.626 m)   Wt 45.4 kg   SpO2 98%   BMI 17.16 kg/m  Physical Exam Vitals and nursing note reviewed.  Constitutional:      General: She is not in acute distress.    Appearance: She is well-developed.  HENT:     Head:  Normocephalic and atraumatic.     Mouth/Throat:     Mouth: Mucous membranes are moist.  Eyes:     Conjunctiva/sclera: Conjunctivae normal.  Cardiovascular:     Rate and Rhythm: Normal rate and regular rhythm.     Heart sounds: No murmur heard. Pulmonary:     Effort: Pulmonary effort is normal. No respiratory distress.     Breath sounds: Normal breath sounds.  Abdominal:     Palpations: Abdomen is soft.     Tenderness: There is no abdominal tenderness.  Musculoskeletal:        General: No swelling.     Cervical back: Neck supple.  Skin:    General: Skin is warm and dry.     Capillary Refill: Capillary refill takes less than 2 seconds.     Findings: Abscess present.  Neurological:     General: No focal deficit present.     Mental Status: She is alert and oriented to person, place, and time.  Psychiatric:        Mood and Affect: Mood normal.        ED Results / Procedures / Treatments   Labs (all labs ordered are listed, but only abnormal results are displayed) Labs Reviewed  CBC WITH DIFFERENTIAL/PLATELET - Abnormal;  Notable for the following components:      Result Value   Hemoglobin 10.8 (*)    HCT 35.5 (*)    MCH 24.8 (*)    Eosinophils Absolute 0.6 (*)    All other components within normal limits  COMPREHENSIVE METABOLIC PANEL - Abnormal; Notable for the following components:   Glucose, Bld 121 (*)    Calcium 8.8 (*)    Total Protein 8.2 (*)    Albumin 3.4 (*)    AST 12 (*)    All other components within normal limits  CULTURE, BLOOD (ROUTINE X 2)  CULTURE, BLOOD (ROUTINE X 2)  LACTIC ACID, PLASMA  LACTIC ACID, PLASMA  PREGNANCY, URINE    EKG None  Radiology No results found.  Procedures Procedures    Medications Ordered in ED Medications  doxycycline (VIBRA-TABS) tablet 100 mg (has no administration in time range)    ED Course/ Medical Decision Making/ A&P                             Medical Decision Making This patient presents to the  ED for concern of abscess to bilateral arms, this involves an extensive number of treatment options, and is a complaint that carries with it a high risk of complications and morbidity.  The differential diagnosis includes abscess, cellulitis, pseudoaneurysm, other   Co morbidities that complicate the patient evaluation Patient uses IV heroin   Additional history obtained:  Additional history obtained from EMR External records from outside source obtained and reviewed including recent urgent care visit for the same   Lab Tests:  I Ordered, and personally interpreted labs.  The pertinent results include:  cbc shows no leukocytosis, mild anemia     Problem List / ED Course / Critical interventions / Medication management  Patient has multiple abscesses to her upper extremities with history of IV drug abuse, particularly a large fluctuant mass on the left bicep and she is unable to fully extend the left elbow.  He is mildly tachycardic, labs show no leukocytosis, plan was for CT and then either I&D by myself or possibly surgery admit for IV antibiotics.  Blood cultures ordered.  Patient left AGAINST MEDICAL ADVICE.  She states that she has to pick up her niece.  She demonstrates decision-making capacity, she is here with a friend who also tried to convince her to stay.  We discussed risk of permanent damage to her extremity, loss of extremity or death from overwhelming infection.  She verbalized understanding, she is not willing to stay.  Started on p.o. antibiotics and was encouraged to come back in the morning as she is planning on.  I have reviewed the patients home medicines and have made adjustments as needed   Social Determinants of Health:  IVDA   Test / Admission - Considered:  Plan was for CT with contrast of the left forearm to evaluate depth of abscess, muscular involvement and likely consult surgery and admit for antibiotics.    Amount and/or Complexity of Data  Reviewed Labs: ordered. Radiology: ordered.  Risk Prescription drug management.           Final Clinical Impression(s) / ED Diagnoses Final diagnoses:  Abscess    Rx / DC Orders ED Discharge Orders          Ordered    doxycycline (VIBRAMYCIN) 100 MG capsule  2 times daily        10/04/22 1613  Josem Kaufmann 10/04/22 2107    Rondel Baton, MD 10/05/22 1001

## 2022-10-05 ENCOUNTER — Emergency Department (HOSPITAL_COMMUNITY)
Admission: EM | Admit: 2022-10-05 | Discharge: 2022-10-05 | Disposition: A | Discharge status: Home / Self Care | Payer: 59 | Attending: Student | Admitting: Student

## 2022-10-05 ENCOUNTER — Emergency Department (HOSPITAL_COMMUNITY): Payer: 59

## 2022-10-05 ENCOUNTER — Encounter (HOSPITAL_COMMUNITY): Payer: Self-pay

## 2022-10-05 ENCOUNTER — Other Ambulatory Visit: Payer: Self-pay

## 2022-10-05 DIAGNOSIS — L0291 Cutaneous abscess, unspecified: Secondary | ICD-10-CM

## 2022-10-05 DIAGNOSIS — F17210 Nicotine dependence, cigarettes, uncomplicated: Secondary | ICD-10-CM | POA: Diagnosis not present

## 2022-10-05 DIAGNOSIS — L03116 Cellulitis of left lower limb: Secondary | ICD-10-CM | POA: Diagnosis not present

## 2022-10-05 DIAGNOSIS — L03114 Cellulitis of left upper limb: Secondary | ICD-10-CM | POA: Diagnosis present

## 2022-10-05 LAB — LACTIC ACID, PLASMA: Lactic Acid, Venous: 0.6 mmol/L (ref 0.5–1.9)

## 2022-10-05 LAB — COMPREHENSIVE METABOLIC PANEL
ALT: 10 U/L (ref 0–44)
AST: 16 U/L (ref 15–41)
Albumin: 3.3 g/dL — ABNORMAL LOW (ref 3.5–5.0)
Alkaline Phosphatase: 69 U/L (ref 38–126)
Anion gap: 8 (ref 5–15)
BUN: 11 mg/dL (ref 6–20)
CO2: 24 mmol/L (ref 22–32)
Calcium: 8.6 mg/dL — ABNORMAL LOW (ref 8.9–10.3)
Chloride: 103 mmol/L (ref 98–111)
Creatinine, Ser: 0.74 mg/dL (ref 0.44–1.00)
GFR, Estimated: >60 mL/min (ref >60)
Glucose, Bld: 76 mg/dL (ref 70–99)
Potassium: 4.6 mmol/L (ref 3.5–5.1)
Sodium: 135 mmol/L (ref 135–145)
Total Bilirubin: 0.6 mg/dL (ref 0.3–1.2)
Total Protein: 7.5 g/dL (ref 6.5–8.1)

## 2022-10-05 LAB — CBC WITH DIFFERENTIAL/PLATELET
Abs Immature Granulocytes: 0.03 10*3/uL (ref 0.00–0.07)
Basophils Absolute: 0.1 10*3/uL (ref 0.0–0.1)
Basophils Relative: 1 %
Eosinophils Absolute: 0.8 10*3/uL — ABNORMAL HIGH (ref 0.0–0.5)
Eosinophils Relative: 9 %
HCT: 31.2 % — ABNORMAL LOW (ref 36.0–46.0)
Hemoglobin: 9.7 g/dL — ABNORMAL LOW (ref 12.0–15.0)
Immature Granulocytes: 0 %
Lymphocytes Relative: 28 %
Lymphs Abs: 2.5 10*3/uL (ref 0.7–4.0)
MCH: 25.1 pg — ABNORMAL LOW (ref 26.0–34.0)
MCHC: 31.1 g/dL (ref 30.0–36.0)
MCV: 80.8 fL (ref 80.0–100.0)
Monocytes Absolute: 0.7 10*3/uL (ref 0.1–1.0)
Monocytes Relative: 8 %
Neutro Abs: 4.9 10*3/uL (ref 1.7–7.7)
Neutrophils Relative %: 54 %
Platelets: 300 10*3/uL (ref 150–400)
RBC: 3.86 MIL/uL — ABNORMAL LOW (ref 3.87–5.11)
RDW: 14.7 % (ref 11.5–15.5)
WBC: 9.1 10*3/uL (ref 4.0–10.5)
nRBC: 0 % (ref 0.0–0.2)

## 2022-10-05 MED ORDER — CEFADROXIL 500 MG PO CAPS
500.0000 mg | ORAL_CAPSULE | Freq: Two times a day (BID) | ORAL | Status: DC
  Administered 2022-10-05: 500 mg via ORAL
  Filled 2022-10-05: qty 1

## 2022-10-05 MED ORDER — SULFAMETHOXAZOLE-TRIMETHOPRIM 800-160 MG PO TABS
1.0000 | ORAL_TABLET | Freq: Once | ORAL | Status: AC
  Administered 2022-10-05: 1 via ORAL
  Filled 2022-10-05: qty 1

## 2022-10-05 MED ORDER — LIDOCAINE-EPINEPHRINE (PF) 2 %-1:200000 IJ SOLN
10.0000 mL | Freq: Once | INTRAMUSCULAR | Status: AC
  Administered 2022-10-05: 10 mL via INTRADERMAL
  Filled 2022-10-05: qty 20

## 2022-10-05 MED ORDER — SODIUM CHLORIDE (PF) 0.9 % IJ SOLN
INTRAMUSCULAR | Status: AC
  Filled 2022-10-05: qty 50

## 2022-10-05 MED ORDER — IOHEXOL 300 MG/ML  SOLN
100.0000 mL | Freq: Once | INTRAMUSCULAR | Status: AC | PRN
  Administered 2022-10-05: 100 mL via INTRAVENOUS

## 2022-10-05 MED ORDER — SULFAMETHOXAZOLE-TRIMETHOPRIM 800-160 MG PO TABS: 1.0000 | ORAL_TABLET | Freq: Two times a day (BID) | ORAL | 0 refills | Status: AC

## 2022-10-05 MED ORDER — CEFADROXIL 500 MG PO CAPS: 500.0000 mg | ORAL_CAPSULE | Freq: Two times a day (BID) | ORAL | 0 refills | Status: AC

## 2022-10-05 NOTE — ED Triage Notes (Signed)
Pt to er, pt states that she is here for an abscess.  States that she has had the abscess for the past week.  Pt states that she was here yesterday for the same, but left, states that she had a ct scan and ultrasound.

## 2022-10-05 NOTE — ED Provider Notes (Signed)
[name] AT [facility], [place] Provider Note  CSN: [id number] Arrival date & time: 10/05/22 1235  Chief Complaint(s) Abscess  HPI Emily Logan is a 27 y.o. female with PMH IV drug use primarily heroin who presents emergency department for evaluation of multiple skin abscesses.  Patient was seen here in the emergency department yesterday with laboratory workup performed but patient left AMA prior to completion of CT imaging due to concern for possible deep space abscess to the left upper extremity.  Patient arrives with a large abscess over the left biceps and a smaller additional abscess over the brachial radialis on the left.  Endorses significant pain to these areas but denies fever, chest pain, shortness of breath, abdominal pain, nausea, vomiting, chills or any other systemic symptoms.  Of note, patient states that since leaving the emergency department yesterday, she attempted to aspirate the biceps abscess with a dirty heroin needle which did appear to have a removed a large amount of pus but her pain remains today   Past Medical History Past Medical History:  Diagnosis Date   Heroin abuse [facility])    Patient Active Problem List   Diagnosis Date Noted   Suicidal ideation 07/16/2020   Polysubstance abuse (HCC) 07/16/2020   MDD (major depressive disorder) 07/16/2020   Substance induced mood disorder (HCC) 07/15/2020   Home Medication(s) Prior to Admission medications   Medication Sig Start Date End Date Taking? Authorizing Provider  cefadroxil (DURICEF) 500 MG capsule Take 1 capsule (500 mg total) by mouth 2 (two) times daily for 7 days. 10/05/22 10/12/22 Yes Kollin Udell, MD  sulfamethoxazole-trimethoprim (BACTRIM DS) 800-160 MG tablet Take 1 tablet by mouth 2 (two) times daily for 7 days. 10/05/22 10/12/22 Yes Leelynd Maldonado, MD                                                                                                                                     Past Surgical History Past Surgical History:  Procedure Laterality Date   arm surgery     Family History History reviewed. No pertinent family history.  Social History Social History   Tobacco Use   Smoking status: Every Day    Packs/day: 1    Types: Cigarettes   Smokeless tobacco: Never  Vaping Use   Vaping Use: Never used  Substance Use Topics   Alcohol use: Yes   Drug use: Yes    Types: Heroin, IV, Marijuana    Comment: heroin   Allergies Patient has no known allergies.  Review of Systems Review of Systems  Skin:  Positive for rash and wound.    Physical Exam Vital Signs  I have reviewed the triage vital signs BP 103/89   Pulse 78   Temp 97.9 F (36.6 C) (Oral)   Resp 16   Ht 5\' 4"  (1.626 m)   Wt 45.4 kg   SpO2 100%   BMI 17.16  kg/m   Physical Exam Vitals and nursing note reviewed.  Constitutional:      General: She is not in acute distress.    Appearance: She is well-developed.  HENT:     Head: Normocephalic and atraumatic.  Eyes:     Conjunctiva/sclera: Conjunctivae normal.  Cardiovascular:     Rate and Rhythm: Normal rate and regular rhythm.     Heart sounds: No murmur heard. Pulmonary:     Effort: Pulmonary effort is normal. No respiratory distress.     Breath sounds: Normal breath sounds.  Abdominal:     Palpations: Abdomen is soft.     Tenderness: There is no abdominal tenderness.  Musculoskeletal:        General: No swelling.     Cervical back: Neck supple.  Skin:    General: Skin is warm and dry.     Capillary Refill: Capillary refill takes less than 2 seconds.     Findings: Lesion present.  Neurological:     Mental Status: She is alert.  Psychiatric:        Mood and Affect: Mood normal.     ED Results and Treatments Labs (all labs ordered are listed, but only abnormal results are displayed) Labs Reviewed  COMPREHENSIVE METABOLIC PANEL - Abnormal; Notable for the following components:      Result Value   Calcium 8.6  (*)    Albumin 3.3 (*)    All other components within normal limits  CBC WITH DIFFERENTIAL/PLATELET - Abnormal; Notable for the following components:   RBC 3.86 (*)    Hemoglobin 9.7 (*)    HCT 31.2 (*)    MCH 25.1 (*)    Eosinophils Absolute 0.8 (*)    All other components within normal limits  LACTIC ACID, PLASMA                                                                                                                          Radiology CT HUMERUS LEFT W CONTRAST  Result Date: 10/05/2022 CLINICAL DATA:  Abscess the left upper arm for 1 week EXAM: CT OF THE UPPER LEFT EXTREMITY WITH CONTRAST TECHNIQUE: Multidetector CT imaging of the left humerus was performed according to the standard protocol following intravenous contrast administration. RADIATION DOSE REDUCTION: This exam was performed according to the departmental dose-optimization program which includes automated exposure control, adjustment of the mA and/or kV according to patient size and/or use of iterative reconstruction technique. CONTRAST:  OMNIPAQUE IOHEXOL 300 MG/ML  SOLN COMPARISON:  None Available. FINDINGS: Bones/Joint/Cartilage Unremarkable Ligaments Suboptimally assessed by CT. Muscles and Tendons Although primarily subcutaneous, the larger of the two anterior abscesses along the upper arm substantially indents and tracks along the superficial fascia margin of the biceps musculature. Soft tissues Two volar abscesses are present along the upper arm. The first is about at the junction of the mid and distal thirds of the upper arm, with a subcutaneous abscess tracking volar to the lateral margin of  the biceps measuring 1.5 by 0.9 by 3.0 cm (volume = 2 cm^3). The larger abscesses in the mid to distal upper arm volar subcutaneous tissues and measures 4.6 by 2.4 by 4.6 cm (volume = 27 cm^3). Both have thick enhancing margins and surrounding inflammatory stranding in the subcutaneous tissues with adjacent cutaneous thickening.  The larger abscess indents the anterior margin of the biceps and tracks along the superficial fascia margin. IMPRESSION: 1. Two volar abscesses centered in the subcutaneous tissues are present along the upper arm. The larger of the two anterior abscesses measures 27 cc and indents the anterior margin of the biceps and tracks along the superficial fascia margin of the biceps musculature. Electronically Signed   By: Gaylyn Rong M.D.   On: 10/05/2022 15:13    Pertinent labs & imaging results that were available during my care of the patient were reviewed by me and considered in my medical decision making (see MDM for details).  Medications Ordered in ED Medications  cefadroxil (DURICEF) capsule 500 mg (500 mg Oral Given 10/05/22 1641)  iohexol (OMNIPAQUE) 300 MG/ML solution 100 mL (100 mLs Intravenous Contrast Given 10/05/22 1452)  lidocaine-EPINEPHrine (XYLOCAINE W/EPI) 2 %-1:200000 (PF) injection 10 mL (10 mLs Intradermal Given by Other 10/05/22 1600)  sulfamethoxazole-trimethoprim (BACTRIM DS) 800-160 MG per tablet 1 tablet (1 tablet Oral Given 10/05/22 1625)                                                                                                                                     Procedures .Marland KitchenIncision and Drainage  Date/Time: 10/05/2022 8:09 PM  Performed by: Glendora Score, MD Authorized by: Glendora Score, MD   Location:    Type:  Abscess   Size:  3 cm   Location:  Upper extremity   Upper extremity location:  Arm   Arm location:  L upper arm Pre-procedure details:    Skin preparation:  Chlorhexidine with alcohol Sedation:    Sedation type:  None Anesthesia:    Anesthesia method:  Local infiltration   Local anesthetic:  Lidocaine 2% WITH epi Procedure type:    Complexity:  Complex Procedure details:    Incision types:  Single straight   Wound management:  Probed and deloculated and extensive cleaning   Drainage:  Purulent   Drainage amount:  Copious   Wound treatment:   Wound left open   Packing materials:  1/4 in iodoform gauze Post-procedure details:    Procedure completion:  Tolerated well, no immediate complications .Marland KitchenIncision and Drainage  Date/Time: 10/05/2022 8:10 PM  Performed by: Glendora Score, MD Authorized by: Glendora Score, MD   Location:    Type:  Abscess   Size:  2   Location:  Upper extremity   Upper extremity location:  Arm   Arm location:  L lower arm Pre-procedure details:    Skin preparation:  Chlorhexidine with alcohol Sedation:    Sedation type:  None  Anesthesia:    Anesthesia method:  Local infiltration   Local anesthetic:  Lidocaine 2% WITH epi Procedure type:    Complexity:  Simple Procedure details:    Wound management:  Probed and deloculated   Drainage:  Purulent   Drainage amount:  Moderate   Wound treatment:  Wound left open Post-procedure details:    Procedure completion:  Tolerated well, no immediate complications   (including critical care time)  Medical Decision Making / ED Course   This patient presents to the ED for concern of abscess, this involves an extensive number of treatment options, and is a complaint that carries with it a high risk of complications and morbidity.  The differential diagnosis includes abscess, deep space infection, necrotizing fasciitis, retained needle/foreign body, septic emboli  MDM: Patient seen emergency room for evaluation of multiple skin abscesses.  Physical exam with a large abscess over the left bicep that is tender to palpation with surrounding erythema as well as a smaller abscess over the brachial radialis on the left.  Laboratory evaluation largely unremarkable outside of a hemoglobin of 9.7.  Lactic acid normal.  CT humerus showing 2 volar abscesses on the anterior margin of the bicep but no deep space infection as the larger one indents the anterior margin of the bicep and tracks along the superficial fascia.  Both abscesses incised and drained at bedside leading  to evacuation of a copious amount of purulent fluid.  Patient placed on Bactrim Duricef and I had a very long discussion with the patient about cessation of IV drug use.  Patient is already following in the methadone clinic.  Patient discharged with outpatient follow-up.   Additional history obtained: -Additional history obtained from partner -External records from outside source obtained and reviewed including: Chart review including previous notes, labs, imaging, consultation notes   Lab Tests: -I ordered, reviewed, and interpreted labs.   The pertinent results include:   Labs Reviewed  COMPREHENSIVE METABOLIC PANEL - Abnormal; Notable for the following components:      Result Value   Calcium 8.6 (*)    Albumin 3.3 (*)    All other components within normal limits  CBC WITH DIFFERENTIAL/PLATELET - Abnormal; Notable for the following components:   RBC 3.86 (*)    Hemoglobin 9.7 (*)    HCT 31.2 (*)    MCH 25.1 (*)    Eosinophils Absolute 0.8 (*)    All other components within normal limits  LACTIC ACID, PLASMA       Imaging Studies ordered: I ordered imaging studies including CT humerus I independently visualized and interpreted imaging. I agree with the radiologist interpretation   Medicines ordered and prescription drug management: Meds ordered this encounter  Medications   iohexol (OMNIPAQUE) 300 MG/ML solution 100 mL   lidocaine-EPINEPHrine (XYLOCAINE W/EPI) 2 %-1:200000 (PF) injection 10 mL   sulfamethoxazole-trimethoprim (BACTRIM DS) 800-160 MG per tablet 1 tablet   cefadroxil (DURICEF) capsule 500 mg   sulfamethoxazole-trimethoprim (BACTRIM DS) 800-160 MG tablet    Sig: Take 1 tablet by mouth 2 (two) times daily for 7 days.    Dispense:  14 tablet    Refill:  0   cefadroxil (DURICEF) 500 MG capsule    Sig: Take 1 capsule (500 mg total) by mouth 2 (two) times daily for 7 days.    Dispense:  14 capsule    Refill:  0    -I have reviewed the patients home  medicines and have made adjustments as needed  Critical interventions  none    Cardiac Monitoring: The patient was maintained on a cardiac monitor.  I personally viewed and interpreted the cardiac monitored which showed an underlying rhythm of: NSR  Social Determinants of Health:  Factors impacting patients care include: IV drug use, discussion on cessation had   Reevaluation: After the interventions noted above, I reevaluated the patient and found that they have :improved  Co morbidities that complicate the patient evaluation  Past Medical History:  Diagnosis Date   Heroin abuse (HCC)       Dispostion: I considered admission for this patient, but at this time with abscesses incised and drained appropriately, patient is safe for discharge with outpatient follow-up on antibiotics.     Final Clinical Impression(s) / ED Diagnoses Final diagnoses:  Abscess     @PCDICTATION @    Glendora Score, MD 10/05/22 2016

## 2022-10-09 LAB — CULTURE, BLOOD (ROUTINE X 2)
Culture: NO GROWTH
Culture: NO GROWTH
Special Requests: ADEQUATE
Special Requests: ADEQUATE

## 2023-03-27 ENCOUNTER — Ambulatory Visit (HOSPITAL_COMMUNITY)
Admission: EM | Admit: 2023-03-27 | Discharge: 2023-03-27 | Disposition: A | Discharge status: Home / Self Care | Payer: 59

## 2023-03-27 ENCOUNTER — Encounter (HOSPITAL_COMMUNITY): Payer: Self-pay

## 2023-03-27 DIAGNOSIS — K047 Periapical abscess without sinus: Secondary | ICD-10-CM | POA: Diagnosis not present

## 2023-03-27 DIAGNOSIS — K0889 Other specified disorders of teeth and supporting structures: Secondary | ICD-10-CM

## 2023-03-27 MED ORDER — AMOXICILLIN-POT CLAVULANATE 875-125 MG PO TABS: 1.0000 | ORAL_TABLET | Freq: Two times a day (BID) | ORAL | 0 refills | Status: DC

## 2023-03-27 MED ORDER — IBUPROFEN 800 MG PO TABS: 800.0000 mg | ORAL_TABLET | Freq: Three times a day (TID) | ORAL | 0 refills | Status: DC

## 2023-03-27 NOTE — Discharge Instructions (Signed)
Your dental pain is likely due to dental infection. Take  antibiotic as prescribed for the next 7 days to treat your dental infection. Continue use of ibuprofen as needed with food for dental inflammation and pain.   You may also use tylenol as needed for pain. Perform salt water gargles every 3-4 hours.  Schedule an appointment with one of the dentists on the list provided to urgent care today.  If you develop any new or worsening symptoms or if your symptoms do not start to improve, pleases return here or follow-up with your primary care provider. If your symptoms are severe, please go to the emergency room.

## 2023-03-27 NOTE — ED Triage Notes (Signed)
Patient here today with c/o right side upper dental pain X 1 week. She has had an abscess in the same area. Yesterday she noticed swelling.

## 2023-03-27 NOTE — ED Provider Notes (Signed)
[facility]    CSN: [id number] Arrival date & time: 03/27/23  1529      History   Chief Complaint Chief Complaint  Patient presents with  . Dental Problem    HPI Emily Logan is a 27 y.o. female.   Patient presents to urgent care for evaluation of pain and swelling to the right upper mouth that started yesterday.  She states the pain and swelling have improved significantly since onset after laying her face on a heating pad and gargling with salt water.  She has noticed a little bit of drainage from one of her teeth of the right upper mouth where her pain is most significant.  Denies fevers, chills, body aches, nausea, vomiting, and rash.  No sore throat.  She has not had any recent dental work.  History of heroin IV drug abuse.  She has not attempted use of any over-the-counter medications to help with pain prior to arrival.    Past Medical History:  Diagnosis Date  . Heroin abuse [facility])     Patient Active Problem List   Diagnosis Date Noted  . Suicidal ideation 07/16/2020  . Polysubstance abuse (HCC) 07/16/2020  . MDD (major depressive disorder) 07/16/2020  . Substance induced mood disorder (HCC) 07/15/2020    Past Surgical History:  Procedure Laterality Date  . arm surgery      OB History   No obstetric history on file.      Home Medications    Prior to Admission medications   Medication Sig Start Date End Date Taking? Authorizing Provider  amoxicillin-clavulanate (AUGMENTIN) 875-125 MG tablet Take 1 tablet by mouth every 12 (twelve) hours. 03/27/23  Yes Carlisle Beers, FNP  ibuprofen (ADVIL) 800 MG tablet Take 1 tablet (800 mg total) by mouth 3 (three) times daily. 03/27/23  Yes Carlisle Beers, FNP  nicotine (NICODERM CQ - DOSED IN MG/24 HOURS) 21 mg/24hr patch Place 21 mg onto the skin daily. 03/08/23  Yes [provider]  traZODone (DESYREL) 50 MG tablet Take 50 mg by mouth at bedtime. 03/08/23  Yes [provider]    Family History History reviewed. No pertinent family history.  Social History Social History   Tobacco Use  . Smoking status: Every Day    Current packs/day: 1.00    Types: Cigarettes  . Smokeless tobacco: Never  Vaping Use  . Vaping status: Never Used  Substance Use Topics  . Alcohol use: Yes  . Drug use: Yes    Types: Heroin, IV, Marijuana    Comment: heroin     Allergies   Patient has no known allergies.   Review of Systems Review of Systems Per HPI  Physical Exam Triage Vital Signs ED Triage Vitals  Encounter Vitals Group     BP 03/27/23 1712 91/64     Systolic BP Percentile --      Diastolic BP Percentile --      Pulse Rate 03/27/23 1712 89     Resp 03/27/23 1712 16     Temp 03/27/23 1712 98.3 F (36.8 C)     Temp Source 03/27/23 1712 Oral     SpO2 03/27/23 1712 100 %     Weight 03/27/23 1711 120 lb (54.4 kg)     Height 03/27/23 1711 5\' 4"  (1.626 m)     Head Circumference --      Peak Flow --      Pain Score 03/27/23 1711 10     Pain Loc --  Pain Education --      Exclude from Growth Chart --    No data found.  Updated Vital Signs BP 91/64 (BP Location: Right Arm)   Pulse 89   Temp 98.3 F (36.8 C) (Oral)   Resp 16   Ht 5\' 4"  (1.626 m)   Wt 120 lb (54.4 kg)   LMP 03/11/2023 (Approximate)   SpO2 100%   BMI 20.60 kg/m   Visual Acuity Right Eye Distance:   Left Eye Distance:   Bilateral Distance:    Right Eye Near:   Left Eye Near:    Bilateral Near:     Physical Exam Vitals and nursing note reviewed.  Constitutional:      Appearance: She is not ill-appearing or toxic-appearing.  HENT:     Head: Normocephalic and atraumatic.     Right Ear: Hearing and external ear normal.     Left Ear: Hearing and external ear normal.     Nose: Nose normal.     Mouth/Throat:     Lips: Pink.     Mouth: Mucous membranes are moist. No injury.     Dentition: Abnormal dentition. Does not have dentures. Dental tenderness  present. No gingival swelling, dental caries, dental abscesses or gum lesions.     Tongue: No lesions. Tongue does not deviate from midline.     Palate: No mass and lesions.     Pharynx: Oropharynx is clear. Uvula midline. No pharyngeal swelling, oropharyngeal exudate, posterior oropharyngeal erythema or uvula swelling.     Tonsils: No tonsillar exudate or tonsillar abscesses.      Comments: Multiple missing teeth of the upper mouth.  Tender to palpation of the left upper gumline.  No visible dental abscesses on exam. Eyes:     General: Lids are normal. Vision grossly intact. Gaze aligned appropriately.     Extraocular Movements: Extraocular movements intact.     Conjunctiva/sclera: Conjunctivae normal.  Neck:     Trachea: Trachea and phonation normal.  Pulmonary:     Effort: Pulmonary effort is normal.  Musculoskeletal:     Cervical back: Neck supple.  Lymphadenopathy:     Cervical: Cervical adenopathy present.  Skin:    General: Skin is warm and dry.     Capillary Refill: Capillary refill takes less than 2 seconds.     Findings: No rash.  Neurological:     General: No focal deficit present.     Mental Status: She is alert and oriented to person, place, and time. Mental status is at baseline.     Cranial Nerves: No dysarthria or facial asymmetry.  Psychiatric:        Mood and Affect: Mood normal.        Speech: Speech normal.        Behavior: Behavior normal.        Thought Content: Thought content normal.        Judgment: Judgment normal.     UC Treatments / Results  Labs (all labs ordered are listed, but only abnormal results are displayed) Labs Reviewed - No data to display  EKG   Radiology No results found.  Procedures Procedures (including critical care time)  Medications Ordered in UC Medications - No data to display  Initial Impression / Assessment and Plan / UC Course  I have reviewed the triage vital signs and the nursing notes.  Pertinent labs &  imaging results that were available during my care of the patient were reviewed by me and considered  in my medical decision making (see chart for details).   1.  Tooth infection, dental pain Evaluation suggests dental pain secondary to dental infection.  HEENT exam stable and without red flag signs indicating need for advanced imaging/further emergent workup. Will manage this with Augmentin antibiotic and over the counter medications as needed for pain and inflammation/swelling.  Patient is afebrile, nontoxic in appearance, and with hemodynamically stable vital signs.  Information for low cost community dental resources provided.  Encouraged to follow-up with dentist for further management.   Counseled patient on potential for adverse effects with medications prescribed/recommended today, strict ER and return-to-clinic precautions discussed, patient verbalized understanding.    Final Clinical Impressions(s) / UC Diagnoses   Final diagnoses:  Tooth infection  Pain, dental     Discharge Instructions      Your dental pain is likely due to dental infection. Take  antibiotic as prescribed for the next 7 days to treat your dental infection. Continue use of ibuprofen as needed with food for dental inflammation and pain.   You may also use tylenol as needed for pain. Perform salt water gargles every 3-4 hours.  Schedule an appointment with one of the dentists on the list provided to urgent care today.  If you develop any new or worsening symptoms or if your symptoms do not start to improve, pleases return here or follow-up with your primary care provider. If your symptoms are severe, please go to the emergency room.   ED Prescriptions     Medication Sig Dispense Auth. Provider   ibuprofen (ADVIL) 800 MG tablet Take 1 tablet (800 mg total) by mouth 3 (three) times daily. 21 tablet Carlisle Beers, FNP   amoxicillin-clavulanate (AUGMENTIN) 875-125 MG tablet Take 1 tablet by mouth  every 12 (twelve) hours. 14 tablet Carlisle Beers, FNP      PDMP not reviewed this encounter.   Carlisle Beers, [place] 03/27/23 1819

## 2024-01-30 ENCOUNTER — Encounter (HOSPITAL_COMMUNITY): Payer: Self-pay | Admitting: Emergency Medicine

## 2024-01-30 ENCOUNTER — Ambulatory Visit (HOSPITAL_COMMUNITY)
Admission: EM | Admit: 2024-01-30 | Discharge: 2024-01-30 | Disposition: A | Discharge status: Home / Self Care | Payer: MEDICAID | Attending: Family Medicine | Admitting: Family Medicine

## 2024-01-30 ENCOUNTER — Other Ambulatory Visit: Payer: Self-pay

## 2024-01-30 DIAGNOSIS — J069 Acute upper respiratory infection, unspecified: Secondary | ICD-10-CM | POA: Diagnosis not present

## 2024-01-30 LAB — POCT RAPID STREP A (OFFICE): Rapid Strep A Screen: NEGATIVE

## 2024-01-30 LAB — POC SARS CORONAVIRUS 2 AG -  ED: SARS Coronavirus 2 Ag: NEGATIVE

## 2024-01-30 MED ORDER — ACETAMINOPHEN 325 MG PO TABS
ORAL_TABLET | ORAL | Status: AC
  Filled 2024-01-30: qty 2

## 2024-01-30 MED ORDER — ACETAMINOPHEN 325 MG PO TABS
650.0000 mg | ORAL_TABLET | Freq: Once | ORAL | Status: AC
  Administered 2024-01-30: 650 mg via ORAL

## 2024-01-30 MED ORDER — BENZONATATE 100 MG PO CAPS: 100.0000 mg | ORAL_CAPSULE | Freq: Three times a day (TID) | ORAL | 0 refills | Status: AC | PRN

## 2024-01-30 NOTE — ED Provider Notes (Signed)
 [facility]    CSN: [id number] Arrival date & time: 01/30/24  1300      History   Chief Complaint Chief Complaint  Patient presents with   Cough    HPI Emily Logan is a 28 y.o. female.   HPI Here for nasal congestion and cough and fever and chills and myalgia.  Symptoms began 2 days ago.  No nausea or vomiting or diarrhea but she did have some stomach cramping 1 time.  No wheezing or shortness of breath.  She has had some headache.  NKDA  Last menstrual cycle was August 19  Past Medical History:  Diagnosis Date   Heroin abuse [place])     Patient Active Problem List   Diagnosis Date Noted   Suicidal ideation 07/16/2020   Polysubstance abuse (HCC) 07/16/2020   MDD (major depressive disorder) 07/16/2020   Substance induced mood disorder (HCC) 07/15/2020    Past Surgical History:  Procedure Laterality Date   arm surgery      OB History   No obstetric history on file.      Home Medications    Prior to Admission medications   Medication Sig Start Date End Date Taking? Authorizing Provider  benzonatate  (TESSALON ) 100 MG capsule Take 1 capsule (100 mg total) by mouth 3 (three) times daily as needed for cough. 01/30/24  Yes Jean Alejos K, MD  methadone (DOLOPHINE) 10 MG tablet Take 100 mg by mouth every 8 (eight) hours.   Yes [provider]  nicotine  (NICODERM CQ  - DOSED IN MG/24 HOURS) 21 mg/24hr patch Place 21 mg onto the skin daily. 03/08/23   [provider]    Family History History reviewed. No pertinent family history.  Social History Social History   Tobacco Use   Smoking status: Every Day    Current packs/day: 1.00    Types: Cigarettes   Smokeless tobacco: Never  Vaping Use   Vaping status: Some Days  Substance Use Topics   Alcohol use: Yes   Drug use: Yes    Types: Heroin, IV, Marijuana    Comment: heroin     Allergies   Patient has no known allergies.   Review of Systems Review of  Systems   Physical Exam Triage Vital Signs ED Triage Vitals  Encounter Vitals Group     BP 01/30/24 1443 (!) 101/54     Girls Systolic BP Percentile --      Girls Diastolic BP Percentile --      Boys Systolic BP Percentile --      Boys Diastolic BP Percentile --      Pulse Rate 01/30/24 1443 78     Resp 01/30/24 1443 20     Temp 01/30/24 1443 (!) 100.8 F (38.2 C)     Temp Source 01/30/24 1443 Oral     SpO2 01/30/24 1443 95 %     Weight --      Height --      Head Circumference --      Peak Flow --      Pain Score 01/30/24 1440 7     Pain Loc --      Pain Education --      Exclude from Growth Chart --    No data found.  Updated Vital Signs BP (!) 101/54 (BP Location: Right Arm)   Pulse 78   Temp (!) 100.8 F (38.2 C) (Oral)   Resp 20   LMP 01/23/2024 (Approximate)   SpO2 95%  Visual Acuity Right Eye Distance:   Left Eye Distance:   Bilateral Distance:    Right Eye Near:   Left Eye Near:    Bilateral Near:     Physical Exam Vitals reviewed.  Constitutional:      General: She is not in acute distress.    Appearance: She is not ill-appearing, toxic-appearing or diaphoretic.  HENT:     Right Ear: Tympanic membrane and ear canal normal.     Left Ear: Tympanic membrane and ear canal normal.     Nose: Nose normal.     Mouth/Throat:     Mouth: Mucous membranes are moist.     Comments: There is some exudate in the posterior OP, no erythema Eyes:     Extraocular Movements: Extraocular movements intact.     Conjunctiva/sclera: Conjunctivae normal.     Pupils: Pupils are equal, round, and reactive to light.  Cardiovascular:     Rate and Rhythm: Normal rate and regular rhythm.     Heart sounds: No murmur heard. Pulmonary:     Effort: Pulmonary effort is normal. No respiratory distress.     Breath sounds: Normal breath sounds. No stridor. No wheezing, rhonchi or rales.  Musculoskeletal:     Cervical back: Neck supple.  Lymphadenopathy:     Cervical: No  cervical adenopathy.  Skin:    Capillary Refill: Capillary refill takes less than 2 seconds.     Coloration: Skin is not jaundiced or pale.  Neurological:     General: No focal deficit present.     Mental Status: She is alert and oriented to person, place, and time.  Psychiatric:        Behavior: Behavior normal.      UC Treatments / Results  Labs (all labs ordered are listed, but only abnormal results are displayed) Labs Reviewed  POCT RAPID STREP A (OFFICE)  POC SARS CORONAVIRUS 2 AG -  ED    EKG   Radiology No results found.  Procedures Procedures (including critical care time)  Medications Ordered in UC Medications  acetaminophen  (TYLENOL ) tablet 650 mg (650 mg Oral Given 01/30/24 1451)    Initial Impression / Assessment and Plan / UC Course  I have reviewed the triage vital signs and the nursing notes.  Pertinent labs & imaging results that were available during my care of the patient were reviewed by me and considered in my medical decision making (see chart for details).     Rapid strep is negative  COVID test is negative  Tessalon  Perles were sent in for the cough.  Work note is provided Final Clinical Impressions(s) / UC Diagnoses   Final diagnoses:  Viral URI     Discharge Instructions      The rapid strep test was negative  COVID test was negative  This is most likely some other viral illness giving you the symptoms.  Take benzonatate  100 mg, 1 tab every 8 hours as needed for cough.  Make sure you are drinking plenty of fluids       ED Prescriptions     Medication Sig Dispense Auth. Provider   benzonatate  (TESSALON ) 100 MG capsule Take 1 capsule (100 mg total) by mouth 3 (three) times daily as needed for cough. 21 capsule Natane Heward K, MD      I have reviewed the PDMP during this encounter.   Vonna Sharlet POUR, MD 01/30/24 [id number]

## 2024-01-30 NOTE — Discharge Instructions (Signed)
 The rapid strep test was negative  COVID test was negative  This is most likely some other viral illness giving you the symptoms.  Take benzonatate  100 mg, 1 tab every 8 hours as needed for cough.  Make sure you are drinking plenty of fluids

## 2024-01-30 NOTE — ED Triage Notes (Signed)
 Symptoms started 2 days ago.  Complains of scratchy throat, headache, cough.  Reports several co-workers have covid.  Requesting covid testing.  Patient has a congested cough  Has drank hot tea, tylenol 

## 2024-02-21 IMAGING — DX DG CHEST 2V
2 series · 2 of 2 positions shown · non-contrast
Comparison: None.

CLINICAL DATA: Left-sided rib pain.

EXAM:
CHEST - 2 VIEW

[chest pa]
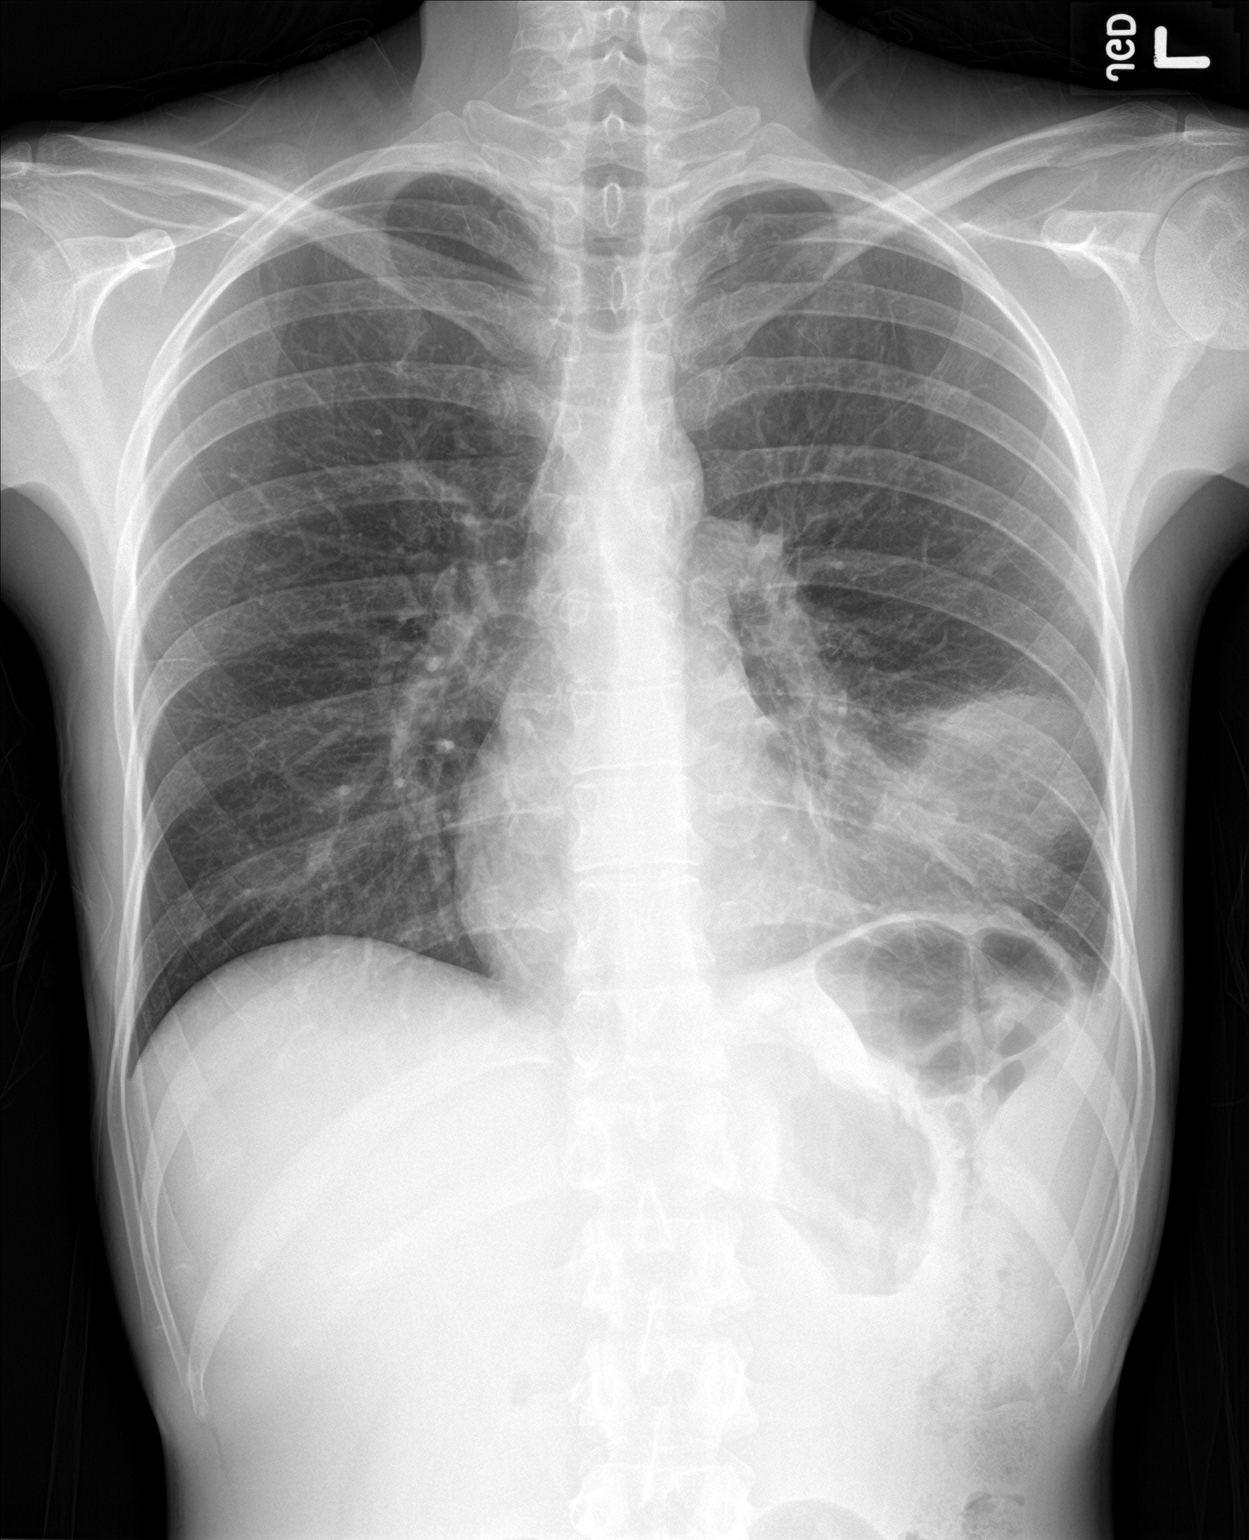

[chest lat]
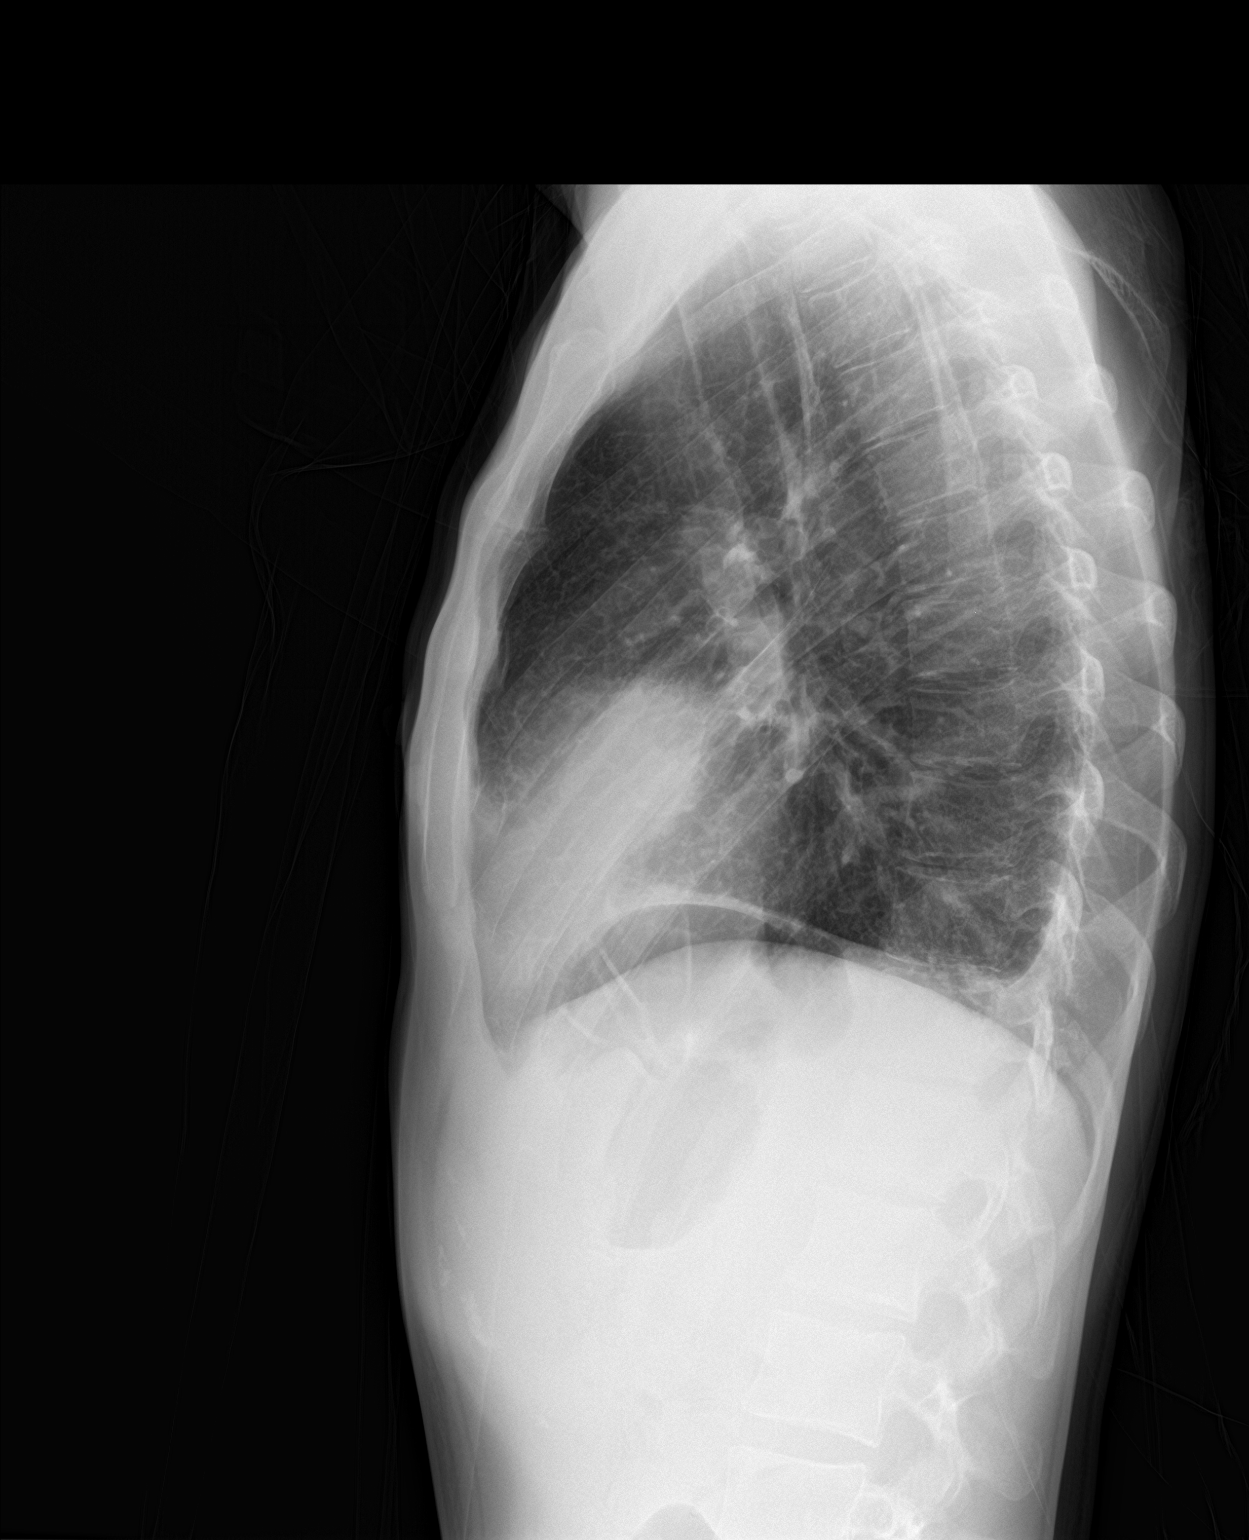

[2 of 2 positions shown; findings below may reference images not displayed]

FINDINGS: Lingular consolidation consistent with pneumonia. Trace left pleural
effusion. Right lung is clear. No pneumothorax. Normal
cardiomediastinal silhouette. No aggressive osseous lesion.
IMPRESSION: 1. Lingular pneumonia.

## 2024-04-04 ENCOUNTER — Other Ambulatory Visit: Payer: Self-pay

## 2024-04-04 ENCOUNTER — Emergency Department (HOSPITAL_COMMUNITY): Payer: MEDICAID

## 2024-04-04 ENCOUNTER — Encounter (HOSPITAL_COMMUNITY): Payer: Self-pay | Admitting: Emergency Medicine

## 2024-04-04 ENCOUNTER — Emergency Department (HOSPITAL_COMMUNITY)
Admission: EM | Admit: 2024-04-04 | Discharge: 2024-04-04 | Disposition: A | Discharge status: Home / Self Care | Payer: MEDICAID

## 2024-04-04 DIAGNOSIS — M542 Cervicalgia: Secondary | ICD-10-CM | POA: Diagnosis present

## 2024-04-04 DIAGNOSIS — S161XXA Strain of muscle, fascia and tendon at neck level, initial encounter: Secondary | ICD-10-CM | POA: Diagnosis not present

## 2024-04-04 DIAGNOSIS — Y92410 Unspecified street and highway as the place of occurrence of the external cause: Secondary | ICD-10-CM | POA: Diagnosis not present

## 2024-04-04 MED ORDER — CYCLOBENZAPRINE HCL 10 MG PO TABS: 5.0000 mg | ORAL_TABLET | Freq: Two times a day (BID) | ORAL | 0 refills | Status: AC | PRN
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
## 2024-04-04 NOTE — Discharge Instructions (Addendum)
 For pain, you can take 1000 mg of Tylenol  or 1 g of Tylenol  every 6-8 hours.  Do not exceed more than 4000 mg or 4 g in a 24-hour period.  You can also take ibuprofen  600 to 800 mg every 6-8 hours as well.  Do not take this high-dose ibuprofen  for greater than a week.   You can use lidocaine  patches as well   Do not drive with the flexeril
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
